# Patient Record
Sex: Female | Born: 2005 | Race: White | Hispanic: No | Marital: Single | Smoking: Never smoker
Health system: Southern US, Community
[De-identification: ages and names within clinical notes are randomized; demographics above are authoritative.]

## PROBLEM LIST (undated history)

## (undated) DIAGNOSIS — T7840XA Allergy, unspecified, initial encounter: Secondary | ICD-10-CM

## (undated) DIAGNOSIS — F419 Anxiety disorder, unspecified: Secondary | ICD-10-CM

## (undated) DIAGNOSIS — G43909 Migraine, unspecified, not intractable, without status migrainosus: Secondary | ICD-10-CM

## (undated) HISTORY — DX: Allergy, unspecified, initial encounter: T78.40XA

---
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
## 2023-01-19 ENCOUNTER — Ambulatory Visit (INDEPENDENT_AMBULATORY_CARE_PROVIDER_SITE_OTHER): Payer: BC Managed Care – PPO

## 2023-01-19 ENCOUNTER — Encounter: Payer: Self-pay | Admitting: Family Medicine

## 2023-01-19 ENCOUNTER — Other Ambulatory Visit: Payer: Self-pay | Admitting: Family Medicine

## 2023-01-19 ENCOUNTER — Ambulatory Visit (INDEPENDENT_AMBULATORY_CARE_PROVIDER_SITE_OTHER): Payer: BC Managed Care – PPO | Admitting: Family Medicine

## 2023-01-19 VITALS — BP 116/77 | HR 85 | Temp 98.7°F | Ht 66.0 in | Wt 316.0 lb

## 2023-01-19 DIAGNOSIS — M79671 Pain in right foot: Secondary | ICD-10-CM | POA: Diagnosis not present

## 2023-01-19 DIAGNOSIS — M25572 Pain in left ankle and joints of left foot: Secondary | ICD-10-CM | POA: Diagnosis not present

## 2023-01-19 DIAGNOSIS — Z6841 Body Mass Index (BMI) 40.0 and over, adult: Secondary | ICD-10-CM

## 2023-01-19 DIAGNOSIS — M79672 Pain in left foot: Secondary | ICD-10-CM

## 2023-01-19 MED ORDER — NAPROXEN 500 MG PO TABS: 500.0000 mg | ORAL_TABLET | Freq: Two times a day (BID) | ORAL | 0 refills | Status: AC

## 2023-01-19 NOTE — Progress Notes (Addendum)
Acute Office Visit  Subjective:     Patient ID: Dana Lopez, female    DOB: [birth year], 17 y.o.   MRN: [id number]  Chief Complaint  Patient presents with   left ankle pain    Flares up and goes away   HPI Patient is in today for foot pain  Has hx of plantar fascitis  States that her feet or her ankles hurt. That they alternate. Contributes it to her weight. Is constantly on her feet for her jobs. Hurts most when she comes home from work. Previously prescribed stretches. Completes them once every other day. Had a referral placed for podiatry but did not go due to mother being admitted to hospital. Denies numbness, tingling, and falls, trauma. States that the pain is bilateral. Wears brooks. States that her brooks are 40 months old. 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   ROS As per HPI    Objective:    BP 116/77   Pulse 85   Temp 98.7 F (37.1 C)   Ht 5' 6"$  (1.676 m)   Wt (!) 316 lb (143.3 kg)   LMP 01/13/2023 (Exact Date)   SpO2 95%   BMI 51.00 kg/m    Physical Exam Constitutional:      General: She is not in acute distress.    Appearance: Normal appearance. She is not ill-appearing, toxic-appearing or diaphoretic.  Cardiovascular:     Rate and Rhythm: Normal rate.     Pulses: Normal pulses.     Heart sounds: Normal heart sounds. No murmur heard.    No gallop.  Pulmonary:     Effort: Pulmonary effort is normal. No respiratory distress.     Breath sounds: Normal breath sounds. No stridor. No wheezing, rhonchi or rales.  Musculoskeletal:     Right ankle: No swelling, deformity, ecchymosis or lacerations. No tenderness. Normal range of motion.     Left ankle: No swelling, deformity, ecchymosis or lacerations. Tenderness present over the proximal fibula. Normal range of motion.     Right foot: Normal range of motion and normal capillary refill. No swelling, deformity, bunion, Charcot foot, foot drop, prominent metatarsal heads,  laceration, tenderness, bony tenderness or crepitus. Normal pulse.     Left foot: Normal range of motion and normal capillary refill. No swelling, deformity, bunion, Charcot foot, foot drop, prominent metatarsal heads, laceration, tenderness, bony tenderness or crepitus. Normal pulse.  Skin:    General: Skin is warm.     Capillary Refill: Capillary refill takes less than 2 seconds.  Neurological:     General: No focal deficit present.     Mental Status: She is alert and oriented to person, place, and time. Mental status is at baseline.     Motor: No weakness.  Psychiatric:        Mood and Affect: Mood normal.        Behavior: Behavior normal.        Thought Content: Thought content normal.        Judgment: Judgment normal.         Assessment & Plan:  1. Left foot pain Previously seen for foot pain and diagnosed with plantar fascitis. Completed conservative treatment. Failed conservative management. Will complete imaging today to rule out stress fracture. Will communicate results to patient once available. Previous imaging from 07/30/21 of lumbar spine with low concern of S1 radiculopathy. Due to allergy of cortisone limited in injection therapy for plantar fascitis. Bilateral  braces provided to patient today in clinic. Medication as below for pain control. Referrals placed to assist with management given failed conservative management.  - Ambulatory referral to Podiatry - Ambulatory referral to Physical Therapy - naproxen (NAPROSYN) 500 MG tablet; Take 1 tablet (500 mg total) by mouth 2 (two) times daily with a meal for 15 days.  Dispense: 30 tablet; Refill: 0  2. BMI 50.0-59.9, adult ([place]) Pt aware that weight reduction would assist with symptom management. States that she is trying to lose weight and is struggling to manage. Pt requested assistance. Referral placed as below.  - Amb ref to Medical Nutrition Therapy-MNT  The above assessment and management plan was discussed with the  patient. The patient verbalized understanding of and has agreed to the management plan using shared-decision making. Patient is aware to call the clinic if they develop any new symptoms or if symptoms fail to improve or worsen. Patient is aware when to return to the clinic for a follow-up visit. Patient educated on when it is appropriate to go to the emergency department.   Donzetta Kohut, DNP-FNP [facility] [address] [place], [place] [address] [phone]

## 2023-01-27 ENCOUNTER — Encounter: Payer: Self-pay | Admitting: Podiatry

## 2023-01-27 ENCOUNTER — Ambulatory Visit (INDEPENDENT_AMBULATORY_CARE_PROVIDER_SITE_OTHER): Payer: BC Managed Care – PPO | Admitting: Podiatry

## 2023-01-27 DIAGNOSIS — Q666 Other congenital valgus deformities of feet: Secondary | ICD-10-CM

## 2023-01-27 DIAGNOSIS — R52 Pain, unspecified: Secondary | ICD-10-CM | POA: Diagnosis not present

## 2023-01-27 NOTE — Progress Notes (Signed)
  Subjective:  Patient ID: Dana Lopez, female    DOB: [birth year],  MRN: [id number]  Chief Complaint  Patient presents with   Foot Pain    Left foot pain  Pt stated that she has xrays in the system    17 y.o. female presents with the above complaint.  Patient presents with left foot pain generally in the arch and heel.  Patient states this came out of nowhere has progressive gotten worse.  She states she had x-rays done in the system.She has not seen a neurologist prior to seeing me.  She denies any other acute complaints.  She has not immobilized the foot and ankle she denies any injury.   Review of Systems: Negative except as noted in the HPI. Denies N/V/F/Ch.  Past Medical History:  Diagnosis Date   Allergy    Migraine     Current Outpatient Medications:    loratadine (CLARITIN) 10 MG tablet, , Disp: , Rfl:    meloxicam (MOBIC) 7.5 MG tablet, Take 1 tablet (7.5 mg total) by mouth daily as needed for pain., Disp: 30 tablet, Rfl: 1  Social History   Tobacco Use  Smoking Status Never  Smokeless Tobacco Never    Allergies  Allergen Reactions   Cortisone     Decreased HR, increased HA (shot only) Prednisone pills ok- per pt and mom    Adhesive [Tape] Rash   Objective:  There were no vitals filed for this visit. There is no height or weight on file to calculate BMI. Constitutional Well developed. Well nourished.  Vascular Dorsalis pedis pulses palpable bilaterally. Posterior tibial pulses palpable bilaterally. Capillary refill normal to all digits.  No cyanosis or clubbing noted. Pedal hair growth normal.  Neurologic Normal speech. Oriented to person, place, and time. Epicritic sensation to light touch grossly present bilaterally.  Dermatologic Nails well groomed and normal in appearance. No open wounds. No skin lesions.  Orthopedic: Pain on palpation to the left arch and heel.  Generalized pain noted across the plantar fascia.  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 Generalized pain   2. Pes planovalgus    Plan:  Patient was evaluated and treated and all questions answered.  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  Right now she has a lot of generalized pain to the foot and ankle.  Could likely be due to overuse injury.  I discussed this with the patient and she states understanding. -Cam boot was dispensed  Pes planovalgus -I explained to patient the etiology of pes planovalgus and relationship with Planter fasciitis/arch pain and various treatment options were discussed.  Given patient foot structure in the setting of Planter fasciitis/arch pain I believe patient will benefit from custom-made orthotics to help control the hindfoot motion support the arch of the foot and take the stress away from plantar fascial.  Patient agrees with the plan like to proceed with orthotics -Patient was casted for orthotics

## 2023-02-18 ENCOUNTER — Ambulatory Visit (INDEPENDENT_AMBULATORY_CARE_PROVIDER_SITE_OTHER): Payer: BC Managed Care – PPO | Admitting: Podiatry

## 2023-02-18 DIAGNOSIS — Q666 Other congenital valgus deformities of feet: Secondary | ICD-10-CM | POA: Diagnosis not present

## 2023-02-18 DIAGNOSIS — R52 Pain, unspecified: Secondary | ICD-10-CM | POA: Diagnosis not present

## 2023-02-18 NOTE — Progress Notes (Signed)
  Subjective:  Patient ID: Dana Lopez, female    DOB: 03-02-2006,  MRN: [id number]  Chief Complaint  Patient presents with   Foot Pain    Pt stated that there has been some improvement     17 y.o. female presents with the above complaint.  Patient presents for follow-up of generalized pain to the left foot.  She states she is doing a lot better cam boot immobilization helped.  She still has some achiness but has improved considerably.   Review of Systems: Negative except as noted in the HPI. Denies N/V/F/Ch.  Past Medical History:  Diagnosis Date   Allergy    Migraine     Current Outpatient Medications:    loratadine (CLARITIN) 10 MG tablet, , Disp: , Rfl:    meloxicam (MOBIC) 7.5 MG tablet, Take 1 tablet (7.5 mg total) by mouth daily as needed for pain., Disp: 30 tablet, Rfl: 1  Social History   Tobacco Use  Smoking Status Never  Smokeless Tobacco Never    Allergies  Allergen Reactions   Cortisone     Decreased HR, increased HA (shot only) Prednisone pills ok- per pt and mom    Adhesive [Tape] Rash   Objective:  There were no vitals filed for this visit. There is no height or weight on file to calculate BMI. Constitutional Well developed. Well nourished.  Vascular Dorsalis pedis pulses palpable bilaterally. Posterior tibial pulses palpable bilaterally. Capillary refill normal to all digits.  No cyanosis or clubbing noted. Pedal hair growth normal.  Neurologic Normal speech. Oriented to person, place, and time. Epicritic sensation to light touch grossly present bilaterally.  Dermatologic Nails well groomed and normal in appearance. No open wounds. No skin lesions.  Orthopedic: No further pain on palpation to the left arch and heel.  No further generalized pain noted across the plantar fascia.  She has severe pes planovalgus deformity noted with calcaneovalgus to many toe signs partially able to recruit the arch with dorsiflexion of the hallux able to perform  single and double heel raise with return of calcaneus to neutral position   Radiographs: None Assessment:   No diagnosis found.  Plan:  Patient was evaluated and treated and all questions answered.  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  She will transition out of cam boot into regular shoes with a Tri-Lock ankle brace Tri-Lock ankle brace was dispensed.  If it continues to bother her she will come back and see me. -Also discussed shoe gear modification  Pes planovalgus -I explained to patient the etiology of pes planovalgus and relationship with Planter fasciitis/arch pain and various treatment options were discussed.  Given patient foot structure in the setting of Planter fasciitis/arch pain I believe patient will benefit from custom-made orthotics to help control the hindfoot motion support the arch of the foot and take the stress away from plantar fascial.  Patient agrees with the plan like to proceed with orthotics -Patient is awaiting orthotics

## 2023-03-01 ENCOUNTER — Telehealth: Payer: Self-pay | Admitting: Podiatry

## 2023-03-01 NOTE — Telephone Encounter (Signed)
Lmom for patient to call back to set up appt to pick up orthotics   No balance

## 2023-03-03 ENCOUNTER — Ambulatory Visit: Payer: BC Managed Care – PPO | Admitting: Nutrition

## 2023-03-10 ENCOUNTER — Telehealth (INDEPENDENT_AMBULATORY_CARE_PROVIDER_SITE_OTHER): Payer: BC Managed Care – PPO | Admitting: Family Medicine

## 2023-03-10 ENCOUNTER — Encounter: Payer: Self-pay | Admitting: Family Medicine

## 2023-03-10 DIAGNOSIS — J069 Acute upper respiratory infection, unspecified: Secondary | ICD-10-CM

## 2023-03-10 MED ORDER — CHLORPHEN-PE-ACETAMINOPHEN 4-10-325 MG PO TABS: 1.0000 | ORAL_TABLET | Freq: Four times a day (QID) | ORAL | 0 refills | Status: DC | PRN

## 2023-03-10 NOTE — Progress Notes (Signed)
Virtual Visit via Video   I connected with patient on 03/10/23 at 1245 by a video enabled telemedicine application and verified that I am speaking with the correct person using two identifiers.  Location patient: Home Location provider: [facility] Office Persons participating in the virtual visit: Patient and Provider  I discussed the limitations of evaluation and management by telemedicine and the availability of in person appointments. The patient expressed understanding and agreed to proceed.  Subjective:   HPI:  Pt presents today for  Chief Complaint  Patient presents with   URI   URI  This is a new problem. Episode onset: 3 days ago. The problem has been gradually improving. The maximum temperature recorded prior to her arrival was 101 - 101.9 F. The fever has been present for Less than 1 day. Associated symptoms include congestion, coughing, headaches, rhinorrhea, a sore throat and swollen glands. Pertinent negatives include no abdominal pain, chest pain, diarrhea, dysuria, ear pain, joint pain, joint swelling, nausea, neck pain, plugged ear sensation, rash, sinus pain, sneezing, vomiting or wheezing. She has tried acetaminophen and decongestant for the symptoms. The treatment provided moderate relief.     Review of Systems  Constitutional:  Positive for chills, fever and malaise/fatigue. Negative for diaphoresis and weight loss.  HENT:  Positive for congestion, rhinorrhea and sore throat. Negative for ear discharge, ear pain, hearing loss, nosebleeds, sinus pain, sneezing and tinnitus.   Respiratory:  Positive for cough. Negative for sputum production, shortness of breath, wheezing and stridor.   Cardiovascular:  Negative for chest pain and palpitations.  Gastrointestinal:  Negative for abdominal pain, blood in stool, constipation, diarrhea, heartburn, melena, nausea and vomiting.  Genitourinary:  Negative for dysuria, flank pain, hematuria and  urgency.  Musculoskeletal:  Negative for back pain, falls, joint pain, myalgias and neck pain.  Skin:  Negative for rash.  Neurological:  Positive for headaches. Negative for dizziness, tingling, tremors, sensory change, speech change, focal weakness, seizures, loss of consciousness and weakness.  All other systems reviewed and are negative.    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   Menorrhagia with irregular cycle 07/29/2016   Essential hypertension 12/31/2015   Snoring 09/30/2015   Obesity peds (BMI >=95 percentile) 03/05/2014    Social History   Tobacco Use   Smoking status: Never   Smokeless tobacco: Never  Substance Use Topics   Alcohol use: No    Current Outpatient Medications:    Chlorphen-PE-Acetaminophen 4-10-325 MG TABS, Take 1 tablet by mouth every 6 (six) hours as needed., Disp: 20 tablet, Rfl: 0   loratadine (CLARITIN) 10 MG tablet, , Disp: , Rfl:    meloxicam (MOBIC) 7.5 MG tablet, Take 1 tablet (7.5 mg total) by mouth daily as needed for pain., Disp: 30 tablet, Rfl: 1  Allergies  Allergen Reactions   Cortisone     Decreased HR, increased HA (shot only) Prednisone pills ok- per pt and mom    Adhesive [Tape] Rash    Objective:   There were no vitals taken for this visit.  Patient is well-developed, well-nourished in no  acute distress.  Resting comfortably at home.  Head is normocephalic, atraumatic.  No labored breathing.  Speech is clear and coherent with logical content.  Patient is alert and  oriented at baseline.  No distress noted  Assessment and Plan:   Dana Lopez was seen today for uri.  Diagnoses and all orders for this visit:  URI with cough and congestion Symptoms are gradually improving. No indications of acute bacterial infections. Will treat symptoms with below. Aware to report new, worsening, or return of symptoms. Symptomatic care discussed in detail.  -     Chlorphen-PE-Acetaminophen 4-10-325 MG TABS; Take 1 tablet by mouth every 6 (six) hours as needed.      Return if symptoms worsen or fail to improve.  Kari Baars, FNP-C [facility] [address] [place], [place] [address] [phone]  03/10/2023  Time spent with the patient: 13 minutes, of which >50% was spent in obtaining information about symptoms, reviewing previous labs, evaluations, and treatments, counseling about condition (please see the discussed topics above), and developing a plan to further investigate it; had a number of questions which I addressed.

## 2023-04-01 ENCOUNTER — Ambulatory Visit: Payer: BC Managed Care – PPO | Admitting: Nutrition

## 2023-05-03 ENCOUNTER — Ambulatory Visit: Payer: BC Managed Care – PPO | Admitting: Nutrition

## 2023-06-08 ENCOUNTER — Ambulatory Visit: Payer: BC Managed Care – PPO | Admitting: Physical Therapy

## 2023-06-09 ENCOUNTER — Other Ambulatory Visit: Payer: BC Managed Care – PPO

## 2023-07-05 ENCOUNTER — Telehealth (INDEPENDENT_AMBULATORY_CARE_PROVIDER_SITE_OTHER): Payer: BC Managed Care – PPO | Admitting: Family Medicine

## 2023-07-05 DIAGNOSIS — H60332 Swimmer's ear, left ear: Secondary | ICD-10-CM | POA: Diagnosis not present

## 2023-07-05 MED ORDER — CIPROFLOXACIN-DEXAMETHASONE 0.3-0.1 % OT SUSP: 4.0000 [drp] | Freq: Two times a day (BID) | OTIC | 0 refills | Status: AC

## 2023-07-05 NOTE — Progress Notes (Signed)
MyChart Video visit  Subjective: IO:[id number] PCP: Raliegh Ip, DO [id number] Dana Lopez is a 17 y.o. female. Patient provides verbal consent for consult held via video.  Due to COVID-19 pandemic this visit was conducted virtually. This visit type was conducted due to national recommendations for restrictions regarding the COVID-19 Pandemic (e.g. social distancing, sheltering in place) in an effort to limit this patient's exposure and mitigate transmission in our community. All issues noted in this document were discussed and addressed.  A physical exam was not performed with this format.   Location of patient: home Location of provider: [facility] Others present for call: none  1. Earache Patient reports that she was at the beach last [place].  She reports she started having left ear pain.  She reports mild drainage out of ear.  Reports subjective fever at home.  No myalgia, shortness of breath, cough.  Pain radiating to jaw. Took tylenol.     ROS: Per HPI  Allergies  Allergen Reactions   Cortisone     Decreased HR, increased HA (shot only) Prednisone pills ok- per pt and mom    Adhesive [Tape] Rash   Past Medical History:  Diagnosis Date   Allergy    Migraine     Current Outpatient Medications:    Chlorphen-PE-Acetaminophen 4-10-325 MG TABS, Take 1 tablet by mouth every 6 (six) hours as needed., Disp: 20 tablet, Rfl: 0   loratadine (CLARITIN) 10 MG tablet, , Disp: , Rfl:    meloxicam (MOBIC) 7.5 MG tablet, Take 1 tablet (7.5 mg total) by mouth daily as needed for pain., Disp: 30 tablet, Rfl: 1 Gen: Well-appearing female no acute distress. HEENT: No facial swelling or redness appreciated  Assessment/ Plan: 17 y.o. female   Acute swimmer's ear of left side - Plan: ciprofloxacin-dexamethasone (CIPRODEX) OTIC suspension  Suspect acute otitis externa.  Ciprodex sent.  Home care instructions reviewed and reasons for reevaluation discussed.  She will follow-up as needed  Start  time: 1:30pm End time: 1:34pm  Total time spent on patient care (including video visit/ documentation): 5 minutes  Osha Rane Hulen Skains, DO [facility] [phone]

## 2023-07-06 DIAGNOSIS — H6502 Acute serous otitis media, left ear: Secondary | ICD-10-CM | POA: Diagnosis not present

## 2023-07-06 DIAGNOSIS — H60502 Unspecified acute noninfective otitis externa, left ear: Secondary | ICD-10-CM | POA: Diagnosis not present

## 2023-07-10 ENCOUNTER — Other Ambulatory Visit: Payer: Self-pay

## 2023-07-10 ENCOUNTER — Emergency Department (HOSPITAL_COMMUNITY)
Admission: EM | Admit: 2023-07-10 | Discharge: 2023-07-10 | Disposition: A | Discharge status: Home / Self Care | Payer: BC Managed Care – PPO | Source: Home / Self Care | Attending: Emergency Medicine | Admitting: Emergency Medicine

## 2023-07-10 DIAGNOSIS — H60502 Unspecified acute noninfective otitis externa, left ear: Secondary | ICD-10-CM

## 2023-07-10 DIAGNOSIS — H6092 Unspecified otitis externa, left ear: Secondary | ICD-10-CM | POA: Diagnosis not present

## 2023-07-10 DIAGNOSIS — H9202 Otalgia, left ear: Secondary | ICD-10-CM | POA: Diagnosis not present

## 2023-07-10 MED ORDER — OXYCODONE-ACETAMINOPHEN 5-325 MG PO TABS
2.0000 | ORAL_TABLET | Freq: Once | ORAL | Status: AC
  Administered 2023-07-10: 2 via ORAL
  Filled 2023-07-10: qty 2

## 2023-07-10 NOTE — Discharge Instructions (Addendum)
We have given you a dose of pain medication and have also placed a wick in your ear.  Please make sure that you are putting the drops in your ear, 4 or 5 drops at a time, unfortunately this can take several days to start to feel like it is getting better so in the meantime please make sure you are using the medications exactly as prescribed, taking the oral antibiotics exactly as prescribed and following up with an ear nose and throat specialist as I have outlined above.  Return to the ER for severe pain or fevers or worsening symptoms

## 2023-07-10 NOTE — ED Triage Notes (Signed)
Pt dx with swimmers ear on 5 days ago however the ear drops prescribed were no available at the pharmacy. The next day went to urgent care and given antibiotic of amoxicillin. Pt states that ear has been getting worse and left side of face is swelling. And today ear start draining.

## 2023-07-10 NOTE — ED Provider Notes (Signed)
[place] EMERGENCY DEPARTMENT AT [facility] Provider Note   CSN: [id number] Arrival date & time: 07/10/23  2047     History  Chief Complaint  Patient presents with   Otalgia    Dana Lopez is a 17 y.o. female.   Otalgia  17 year old female presenting with a complaint of left-sided ear pain.  She has been to the doctor several times over this week and has received 2 different prescriptions for antibiotics both drops for otitis externa as well as for an oral antibiotic secondary to some welling around the ear.  Today she had some drainage from the ear, she has felt some increasing pain, they have not started the topical drops because they were not able to pick it up until tonight.  She states that there has been no fevers but she does have a headache and some pain around that ear.    Home Medications Prior to Admission medications   Medication Sig Start Date End Date Taking? Authorizing Provider  Chlorphen-PE-Acetaminophen 4-10-325 MG TABS Take 1 tablet by mouth every 6 (six) hours as needed. 03/10/23   Sonny Masters, FNP  ciprofloxacin-dexamethasone (CIPRODEX) OTIC suspension Place 4 drops into the left ear 2 (two) times daily for 7 days. 07/05/23 07/12/23  Raliegh Ip, DO  loratadine (CLARITIN) 10 MG tablet  03/03/18   [provider]  meloxicam (MOBIC) 7.5 MG tablet Take 1 tablet (7.5 mg total) by mouth daily as needed for pain. 10/30/22   Raliegh Ip, DO      Allergies    Cortisone and Adhesive [tape]    Review of Systems   Review of Systems  HENT:  Positive for ear pain.     Physical Exam Updated Vital Signs BP (!) 141/91 (BP Location: Right Arm)   Pulse 92   Temp 99 F (37.2 C) (Oral)   Ht 1.676 m (5\' 6" )   Wt (!) 145.2 kg   LMP 06/23/2023 (Exact Date)   SpO2 100%   BMI 51.65 kg/m  Physical Exam Vitals and nursing note reviewed.  Constitutional:      Appearance: She is well-developed. She is not diaphoretic.  HENT:      Head: Normocephalic and atraumatic.     Ears:     Comments: Right tympanic membrane visualized and normal, no pain on the right external auditory canal or manipulation of the auricle or tragus.  The left tympanic membrane is not visualized, the external auditory canal is edematous and tender, there is pain with manipulation of the auricle and the tragus, there is no tenderness over the mastoid.  There is some purulent drainage coming from the inside of the ear Eyes:     General:        Right eye: No discharge.        Left eye: No discharge.     Conjunctiva/sclera: Conjunctivae normal.  Pulmonary:     Effort: Pulmonary effort is normal. No respiratory distress.  Skin:    General: Skin is warm and dry.     Findings: No erythema or rash.  Neurological:     Mental Status: She is alert.     Coordination: Coordination normal.     ED Results / Procedures / Treatments   Labs (all labs ordered are listed, but only abnormal results are displayed) Labs Reviewed - No data to display  EKG None  Radiology No results found.  Procedures Procedures    Medications Ordered in ED Medications  oxyCODONE-acetaminophen (PERCOCET/ROXICET)  5-325 MG per tablet 2 tablet (has no administration in time range)    ED Course/ Medical Decision Making/ A&P                                 Medical Decision Making  Overall patient is well-appearing, she is not febrile, she is not tachycardic, she has tenderness around the left ear consistent with having otitis externa.  She is up to this point not been adequately treated she has not been using the drops but now has them on her person.  I have recommended that they continue the Augmentin, the drops, I have placed an ear wick, the patient tolerated this well, I have referred her to ENT, the patient is agreeable, the family member here is also willing to help out.  Stable for discharge        Final Clinical Impression(s) / ED Diagnoses Final diagnoses:   Acute otitis externa of left ear, unspecified type    Rx / DC Orders ED Discharge Orders     None         Eber Hong, MD 07/10/23 2250

## 2023-08-31 ENCOUNTER — Encounter: Payer: Self-pay | Admitting: Nurse Practitioner

## 2023-08-31 ENCOUNTER — Ambulatory Visit (INDEPENDENT_AMBULATORY_CARE_PROVIDER_SITE_OTHER): Payer: BC Managed Care – PPO | Admitting: Nurse Practitioner

## 2023-08-31 VITALS — BP 112/74 | HR 82 | Temp 97.2°F | Resp 20 | Ht 66.0 in | Wt 314.4 lb

## 2023-08-31 DIAGNOSIS — F419 Anxiety disorder, unspecified: Secondary | ICD-10-CM | POA: Diagnosis not present

## 2023-08-31 DIAGNOSIS — F411 Generalized anxiety disorder: Secondary | ICD-10-CM | POA: Diagnosis not present

## 2023-08-31 DIAGNOSIS — F331 Major depressive disorder, recurrent, moderate: Secondary | ICD-10-CM | POA: Diagnosis not present

## 2023-08-31 DIAGNOSIS — F32A Depression, unspecified: Secondary | ICD-10-CM | POA: Diagnosis not present

## 2023-08-31 MED ORDER — ESCITALOPRAM OXALATE 10 MG PO TABS: 10.0000 mg | ORAL_TABLET | Freq: Every day | ORAL | 1 refills | Status: DC

## 2023-08-31 NOTE — Progress Notes (Signed)
Acute Office Visit  Subjective:     Patient ID: Dana Lopez, female    DOB: 16-Sep-2006, 17 y.o.   MRN: [id number]  Chief Complaint  Patient presents with   Anxiety   HPI Dana Lopez is 17 yrs old Lesbian female present today with her mother for an acute visit for anxiety. Verbal consent obtained from her to have her mother present in the room for this encounter.  Anxiety: Patient complains of anxiety disorder, sleep disturbance, and "I can fall asleep, but not able to stay asleep 5-6 hrs in a good night and in a not so good night 2-3 hrs" .  She has the following symptoms: difficulty concentrating, dizziness, fatigue, insomnia, racing thoughts, sweating. Onset of symptoms was approximately 1 year ago, fluctuates since that time "at time it is better and other time it is worst. I work 2-jobs and I am having relationship issues. It is always worst around my birthday. I am having hard dealing with getting older and contemplating on going back to school caused me anxiety". She is in a long distance relationship ' we are on a break right and trying to figure out what we have going on, been together for 23yrs and she my best friend. I feel like I am losing my partner ad best friend".Anxiety is the worst "when I am around a lot of people", moving away help improved anxiety  She denies current suicidal and homicidal ideation. Family history significant for anxiety and depression.Possible organic causes contributing are: none. Risk factors: positive family history in  mother and sister(s), negative life event LGBT and having relationship issues, previous episode of depression, and    Previous treatment includes  Has ben in counseling in the past 2 yrs ago for anxiety and depression, and prescribed Visatril, Zoloft, Prozac, Wellbutrin" and individual therapy.  She complains of the following side effects from the treatment:  tiredness, not feeling like herself .  She is receptive to trying Lexapro,  counseling and Genesight test d/t multiple medications failures. Client is requesting to go back her previous counselor  LMP: 9/23/204,      08/31/2023   10:01 AM 01/19/2023   11:12 AM 10/30/2022    1:21 PM  PHQ9 SCORE ONLY  PHQ-9 Total Score 7 8 10        08/31/2023   10:02 AM 01/19/2023   11:13 AM 10/30/2022    1:21 PM 07/02/2022    3:38 PM  GAD 7 : Generalized Anxiety Score  Nervous, Anxious, on Edge 2 2 1 1   Control/stop worrying 2 2 1 1   Worry too much - different things 2 2 1 1   Trouble relaxing 2 2 2 2   Restless 2 2 0 2  Easily annoyed or irritable 2 2 1 2   Afraid - awful might happen 2 0 0 0  Total GAD 7 Score 14 12 6 9   Anxiety Difficulty Somewhat difficult Somewhat difficult Somewhat difficult Not difficult at all     Review of Systems  Constitutional:  Negative for chills and fever.  HENT:  Negative for congestion and sore throat.   Eyes:  Negative for blurred vision and pain.  Respiratory:  Negative for cough and wheezing.   Cardiovascular:  Negative for chest pain and leg swelling.  Gastrointestinal:  Positive for nausea.  Genitourinary:  Negative for frequency and urgency.  Musculoskeletal:  Negative for falls and myalgias.  Skin:  Negative for itching and rash.  Neurological:  Negative for dizziness and headaches.  Endo/Heme/Allergies:  Negative for environmental allergies and polydipsia. Does not bruise/bleed easily.  Psychiatric/Behavioral:  Positive for depression. Negative for hallucinations, substance abuse and suicidal ideas. The patient is nervous/anxious and has insomnia.    Negative unless indicated in HPI    Objective:    BP 112/74   Pulse 82   Temp (!) 97.2 F (36.2 C) (Oral)   Resp 20   Ht 5\' 6"  (1.676 m)   Wt (!) 314 lb 6 oz (142.6 kg)   SpO2 98%   BMI 50.74 kg/m  BP Readings from Last 3 Encounters:  08/31/23 112/74  07/10/23 (!) 140/88  01/19/23 116/77   Wt Readings from Last 3 Encounters:  08/31/23 (!) 314 lb 6 oz (142.6 kg) (>99%,  Z= 2.84)*  07/10/23 (!) 320 lb (145.2 kg) (>99%, Z= 2.85)*  01/19/23 (!) 316 lb (143.3 kg) (>99%, Z= 2.79)*   * Growth percentiles are based on CDC (Girls, 2-20 Years) data.    Physical Exam Vitals and nursing note reviewed.  Constitutional:      Appearance: She is obese.  HENT:     Head: Normocephalic and atraumatic.  Eyes:     General: No scleral icterus.    Extraocular Movements: Extraocular movements intact.     Conjunctiva/sclera: Conjunctivae normal.     Pupils: Pupils are equal, round, and reactive to light.  Neck:     Vascular: No carotid bruit.  Cardiovascular:     Rate and Rhythm: Normal rate and regular rhythm.  Pulmonary:     Effort: Pulmonary effort is normal.     Breath sounds: Normal breath sounds.  Musculoskeletal:        General: Normal range of motion.     Cervical back: Normal range of motion and neck supple. No rigidity or tenderness.     Right lower leg: No edema.     Left lower leg: No edema.  Lymphadenopathy:     Cervical: No cervical adenopathy.  Skin:    General: Skin is warm and dry.     Findings: No rash.  Neurological:     Mental Status: She is alert and oriented to person, place, and time. Mental status is at baseline.  Psychiatric:        Mood and Affect: Mood is anxious. Affect is flat.        Speech: Speech normal.        Behavior: Behavior normal. Behavior is cooperative.        Thought Content: Thought content normal. Thought content does not include homicidal or suicidal ideation. Thought content does not include homicidal or suicidal plan.        Cognition and Memory: Cognition and memory normal.        Judgment: Judgment normal.    No results found for any visits on 08/31/23.     Assessment & Plan:  Anxiety and depression -     Ambulatory referral to Psychiatry -     Escitalopram Oxalate; Take 1 tablet (10 mg total) by mouth daily.  Dispense: 30 tablet; Refill: 1  Dana Lopez is 17 yrs old [place] American female, no acute  distress Anxiety/Depression: start Lexapro 10 mg daily, okay to break to pill in 1/2 for the first 7-days - Refer to therapy " my therapy place" requested by client - GeneSight test ordered  -Encourage sleep hygiene - deep Breathing exercise Encourage healthy lifestyle choices, including diet (rich in fruits, vegetables, and lean proteins, and low in salt and simple carbohydrates) and exercise (  at least 30 minutes of moderate physical activity daily).   The above assessment and management plan was discussed with the patient. The patient verbalized understanding of and has agreed to the management plan. Patient is aware to call the clinic if they develop any new symptoms or if symptoms persist or worsen. Patient is aware when to return to the clinic for a follow-up visit. Patient educated on when it is appropriate to go to the emergency department.  Return in about 4 weeks (around 09/28/2023).  Arrie Aran Santa Lighter, DNP [facility] [address] [place], [place] [address] [phone]

## 2023-09-01 ENCOUNTER — Telehealth: Payer: Self-pay | Admitting: Family Medicine

## 2023-09-01 NOTE — Telephone Encounter (Signed)
Referral Form has been completed and faxed to Office as requested.

## 2023-09-07 ENCOUNTER — Telehealth: Payer: Self-pay | Admitting: Family Medicine

## 2023-09-07 NOTE — Telephone Encounter (Signed)
Patient's mother called in to request information put in daughter's chart be removed.  I told her I would send the amendment request to the patient and she should complete and mail back to me to start the amendment process. Paperwork mailed today.

## 2023-09-28 ENCOUNTER — Ambulatory Visit: Payer: BC Managed Care – PPO | Admitting: Family Medicine

## 2023-09-29 ENCOUNTER — Encounter: Payer: Self-pay | Admitting: Family Medicine

## 2023-09-29 ENCOUNTER — Ambulatory Visit (INDEPENDENT_AMBULATORY_CARE_PROVIDER_SITE_OTHER): Payer: BC Managed Care – PPO | Admitting: Family Medicine

## 2023-09-29 VITALS — BP 138/88 | HR 69 | Temp 98.6°F | Ht 66.0 in | Wt 312.0 lb

## 2023-09-29 DIAGNOSIS — R1013 Epigastric pain: Secondary | ICD-10-CM | POA: Diagnosis not present

## 2023-09-29 DIAGNOSIS — R4589 Other symptoms and signs involving emotional state: Secondary | ICD-10-CM | POA: Diagnosis not present

## 2023-09-29 DIAGNOSIS — L659 Nonscarring hair loss, unspecified: Secondary | ICD-10-CM

## 2023-09-29 DIAGNOSIS — R197 Diarrhea, unspecified: Secondary | ICD-10-CM | POA: Diagnosis not present

## 2023-09-29 MED ORDER — OMEPRAZOLE 20 MG PO CPDR: 20.0000 mg | DELAYED_RELEASE_CAPSULE | Freq: Every morning | ORAL | 0 refills | Status: DC

## 2023-09-29 NOTE — Progress Notes (Signed)
Subjective: CC:GAD/Depression PCP: Raliegh Ip, DO GMW:NUUVOZ Dana Lopez is a 17 y.o. female presenting to clinic today for:  1.  Anxiety about health She does have anxiety about health but she notes that this is because she worries about symptoms that she does not feel like she should have.  She never did start the Lexapro that was prescribed by my partner because she was worried about taking more medicine.  She has been on Wellbutrin, Seroquel, Prozac, Atarax and amitriptyline in the past and all of them made her feel poorly.  She reports that she has been having epigastric pain that sometimes radiates up into the chest.  Sometimes this is relieved by Tums but sometimes it does not.  She does not feel like she is eating anything that would cause her to gain weight or maintain a heavy weight and she stays active working 2 jobs and with her nephew.  However, she has not been able to lose any weight.  She reports that her stools are always runny.  They are dark but she is not seeing overt blood.  Sometimes she throws up with she eats and she knows often certain foods will cause her to feel nauseated.  She takes a hot shower bath and sometimes this alleviates some of the crampy/sore feeling she has in her belly.  She eats yogurt.  Does not take any probiotics or any PPIs.  Her mother's medical history significant for Crohn's disease.  She has never seen a gastroenterologist and is a little reluctant to because she feels like when she sees other doctors a lot of focus is placed on her weight as the etiology of all her symptoms. Patient's last menstrual period was 09/20/2023.    ROS: Per HPI  Allergies  Allergen Reactions   Cortisone     Decreased HR, increased HA (shot only) Prednisone pills ok- per pt and mom    Adhesive [Tape] Rash   Past Medical History:  Diagnosis Date   Allergy    Migraine     Current Outpatient Medications:    escitalopram (LEXAPRO) 10 MG tablet, Take 1 tablet  (10 mg total) by mouth daily., Disp: 30 tablet, Rfl: 1   loratadine (CLARITIN) 10 MG tablet, , Disp: , Rfl:  Social History   Socioeconomic History   Marital status: Single    Spouse name: Not on file   Number of children: Not on file   Years of education: Not on file   Highest education level: Not on file  Occupational History   Not on file  Tobacco Use   Smoking status: Never   Smokeless tobacco: Never  Vaping Use   Vaping status: Never Used  Substance and Sexual Activity   Alcohol use: No   Drug use: No   Sexual activity: Never  Other Topics Concern   Not on file  Social History Narrative   Bailynn is an 10th grade student.   She is at [employer].   She lives with both parents.    She has two older sisters.   Social Determinants of Health   Financial Resource Strain: Not on file  Food Insecurity: Not on file  Transportation Needs: Not on file  Physical Activity: Not on file  Stress: Not on file  Social Connections: Not on file  Intimate Partner Violence: Not on file   Family History  Problem Relation Age of Onset   Crohn's disease Mother    Depression Mother    Asthma Mother  Hypertension Father     Objective: Office vital signs reviewed. BP 138/88   Pulse 69   Temp 98.6 F (37 C)   Ht 5\' 6"  (1.676 m)   Wt (!) 312 lb (141.5 kg)   LMP 09/20/2023   SpO2 99%   BMI 50.36 kg/m   Physical Examination:  General: Awake, alert, obese and well appearing female, No acute distress HEENT: sclera white, MMM GI: obese, nondistended Psych: mood stable     09/29/2023   10:00 AM 09/29/2023    9:56 AM 08/31/2023   10:01 AM  Depression screen PHQ 2/9  Decreased Interest  1 1  Down, Depressed, Hopeless 2 1 1   PHQ - 2 Score 2 2 2   Altered sleeping 2 1 1   Tired, decreased energy 2 1 1   Change in appetite  1 1  Feeling bad or failure about yourself   0 1  Trouble concentrating 2 0 1  Moving slowly or fidgety/restless  0 0  Suicidal thoughts  0 0  PHQ-9  Score 8 5 7   Difficult doing work/chores  Somewhat difficult Somewhat difficult      09/29/2023    9:56 AM 08/31/2023   10:02 AM 01/19/2023   11:13 AM 10/30/2022    1:21 PM  GAD 7 : Generalized Anxiety Score  Nervous, Anxious, on Edge 2 2 2 1   Control/stop worrying 2 2 2 1   Worry too much - different things 2 2 2 1   Trouble relaxing 1 2 2 2   Restless 1 2 2  0  Easily annoyed or irritable 2 2 2 1   Afraid - awful might happen 2 2 0 0  Total GAD 7 Score 12 14 12 6   Anxiety Difficulty Somewhat difficult Somewhat difficult Somewhat difficult Somewhat difficult    Assessment/ Plan: 17 y.o. female   Anxiety about health  Diarrhea, unspecified type - Plan: CMP14+EGFR, TSH, T4, free, CBC with Differential/Platelet, H. pylori antigen, stool, Fecal occult blood, imunochemical([facility])  Epigastric pain - Plan: CMP14+EGFR, CBC with Differential/Platelet, H. pylori antigen, stool, omeprazole (PRILOSEC) 20 MG capsule  Morbid obesity (HCC) - Plan: Bayer DCA Hb A1c Waived, CMP14+EGFR, Lipid panel, TSH, T4, free, VITAMIN D 25 Hydroxy (Vit-D Deficiency, Fractures), Cortisol-am, blood  Hair thinning - Plan: TSH, T4, free  Understandably has some anxiety surrounding her health.  I will be glad to evaluate for any metabolic causes of her symptoms.  I have placed future orders for labs.  She requested cortisol level and I am glad to order that but we discussed much of her physique does not match a true cushingoid body.  I think collecting this lab will alleviate some anxiety however.  Will perform stool studies to evaluate for H. pylori infection as well as FOBT given reports of dark stool.  I think it may be reasonable for her to see a gastroenterologist at some point given family history of Crohn's disease.  I am going to trial her on a PPI for the next month and see her back closely and we can reevaluate symptoms and plan next steps.  We did briefly discuss GLP since she is concerned about her  weight.  I think this might help with some of her lower GI symptoms as well as I have seen that improve bowel movements in many patients.  Total time spent with patient 26 mins.  Greater than 50% of encounter spent in coordination of care/counseling.    Raliegh Ip, DO [facility] [phone]

## 2023-09-29 NOTE — Patient Instructions (Signed)
Get a probiotic for diarrheal symptoms (align, culturelle, etc).  I've included a diet to help with irritable bowel syndrome as well.

## 2023-10-01 ENCOUNTER — Other Ambulatory Visit: Payer: BC Managed Care – PPO

## 2023-10-06 ENCOUNTER — Other Ambulatory Visit: Payer: BC Managed Care – PPO

## 2023-10-08 ENCOUNTER — Other Ambulatory Visit: Payer: BC Managed Care – PPO

## 2023-10-08 ENCOUNTER — Telehealth: Payer: Self-pay | Admitting: Family Medicine

## 2023-10-08 DIAGNOSIS — L659 Nonscarring hair loss, unspecified: Secondary | ICD-10-CM

## 2023-10-08 DIAGNOSIS — R1013 Epigastric pain: Secondary | ICD-10-CM

## 2023-10-08 DIAGNOSIS — R197 Diarrhea, unspecified: Secondary | ICD-10-CM | POA: Diagnosis not present

## 2023-10-08 LAB — BAYER DCA HB A1C WAIVED: HB A1C (BAYER DCA - WAIVED): 5.5 % (ref 4.8–5.6)

## 2023-10-08 NOTE — Telephone Encounter (Signed)
Please see if results are in [facility]

## 2023-10-10 ENCOUNTER — Encounter (HOSPITAL_COMMUNITY): Payer: Self-pay

## 2023-10-10 ENCOUNTER — Telehealth: Payer: BC Managed Care – PPO | Admitting: Family Medicine

## 2023-10-10 ENCOUNTER — Emergency Department (HOSPITAL_COMMUNITY)
Admission: EM | Admit: 2023-10-10 | Discharge: 2023-10-10 | Disposition: A | Discharge status: Home / Self Care | Payer: BC Managed Care – PPO | Attending: Emergency Medicine | Admitting: Emergency Medicine

## 2023-10-10 ENCOUNTER — Other Ambulatory Visit: Payer: Self-pay

## 2023-10-10 DIAGNOSIS — F32A Depression, unspecified: Secondary | ICD-10-CM

## 2023-10-10 DIAGNOSIS — D72829 Elevated white blood cell count, unspecified: Secondary | ICD-10-CM | POA: Diagnosis not present

## 2023-10-10 DIAGNOSIS — R400 Somnolence: Secondary | ICD-10-CM | POA: Diagnosis not present

## 2023-10-10 DIAGNOSIS — R197 Diarrhea, unspecified: Secondary | ICD-10-CM | POA: Diagnosis not present

## 2023-10-10 DIAGNOSIS — R112 Nausea with vomiting, unspecified: Secondary | ICD-10-CM

## 2023-10-10 DIAGNOSIS — F419 Anxiety disorder, unspecified: Secondary | ICD-10-CM | POA: Diagnosis not present

## 2023-10-10 DIAGNOSIS — R5383 Other fatigue: Secondary | ICD-10-CM | POA: Insufficient documentation

## 2023-10-10 DIAGNOSIS — F410 Panic disorder [episodic paroxysmal anxiety] without agoraphobia: Secondary | ICD-10-CM | POA: Diagnosis not present

## 2023-10-10 HISTORY — DX: Anxiety disorder, unspecified: F41.9

## 2023-10-10 LAB — CBC
HCT: 41.3 % (ref 36.0–46.0)
Hemoglobin: 13.4 g/dL (ref 12.0–15.0)
MCH: 27.5 pg (ref 26.0–34.0)
MCHC: 32.4 g/dL (ref 30.0–36.0)
MCV: 84.6 fL (ref 80.0–100.0)
Platelets: 398 10*3/uL (ref 150–400)
RBC: 4.88 MIL/uL (ref 3.87–5.11)
RDW: 13.5 % (ref 11.5–15.5)
WBC: 12.1 10*3/uL — ABNORMAL HIGH (ref 4.0–10.5)
nRBC: 0 % (ref 0.0–0.2)

## 2023-10-10 LAB — HEPATIC FUNCTION PANEL
ALT: 25 U/L (ref 0–44)
AST: 26 U/L (ref 15–41)
Albumin: 3.4 g/dL — ABNORMAL LOW (ref 3.5–5.0)
Alkaline Phosphatase: 89 U/L (ref 38–126)
Bilirubin, Direct: <0.1 mg/dL (ref 0.0–0.2)
Total Bilirubin: 0.5 mg/dL (ref <1.2)
Total Protein: 7.4 g/dL (ref 6.5–8.1)

## 2023-10-10 LAB — BASIC METABOLIC PANEL
Anion gap: 7 (ref 5–15)
BUN: 6 mg/dL (ref 6–20)
CO2: 26 mmol/L (ref 22–32)
Calcium: 9.3 mg/dL (ref 8.9–10.3)
Chloride: 104 mmol/L (ref 98–111)
Creatinine, Ser: 1.08 mg/dL — ABNORMAL HIGH (ref 0.44–1.00)
GFR, Estimated: >60 mL/min (ref >60)
Glucose, Bld: 102 mg/dL — ABNORMAL HIGH (ref 70–99)
Potassium: 3.6 mmol/L (ref 3.5–5.1)
Sodium: 137 mmol/L (ref 135–145)

## 2023-10-10 LAB — HCG, SERUM, QUALITATIVE: Preg, Serum: NEGATIVE

## 2023-10-10 LAB — CBG MONITORING, ED: Glucose-Capillary: 95 mg/dL (ref 70–99)

## 2023-10-10 MED ORDER — LIDOCAINE-EPINEPHRINE-TETRACAINE (LET) TOPICAL GEL
3.0000 mL | Freq: Once | TOPICAL | Status: DC
  Filled 2023-10-10: qty 3

## 2023-10-10 MED ORDER — ONDANSETRON HCL 4 MG/2ML IJ SOLN
4.0000 mg | Freq: Once | INTRAMUSCULAR | Status: AC
  Administered 2023-10-10: 4 mg via INTRAVENOUS
  Filled 2023-10-10: qty 2

## 2023-10-10 MED ORDER — ONDANSETRON HCL 4 MG PO TABS: 4.0000 mg | ORAL_TABLET | Freq: Four times a day (QID) | ORAL | 0 refills | Status: DC

## 2023-10-10 MED ORDER — KETOROLAC TROMETHAMINE 15 MG/ML IJ SOLN
15.0000 mg | Freq: Once | INTRAMUSCULAR | Status: AC
  Administered 2023-10-10: 15 mg via INTRAVENOUS
  Filled 2023-10-10: qty 1

## 2023-10-10 MED ORDER — DROPERIDOL 2.5 MG/ML IJ SOLN
1.2500 mg | Freq: Once | INTRAMUSCULAR | Status: AC
  Administered 2023-10-10: 1.25 mg via INTRAVENOUS
  Filled 2023-10-10: qty 2

## 2023-10-10 MED ORDER — LACTATED RINGERS IV BOLUS
1000.0000 mL | Freq: Once | INTRAVENOUS | Status: AC
  Administered 2023-10-10: 1000 mL via INTRAVENOUS

## 2023-10-10 MED ORDER — DIPHENHYDRAMINE HCL 50 MG/ML IJ SOLN
25.0000 mg | Freq: Once | INTRAMUSCULAR | Status: AC
  Administered 2023-10-10: 25 mg via INTRAVENOUS
  Filled 2023-10-10: qty 1

## 2023-10-10 NOTE — Patient Instructions (Signed)

## 2023-10-10 NOTE — Progress Notes (Signed)
Virtual Visit Consent   Dana Lopez, you are scheduled for a virtual visit with a [facility] provider today. Just as with appointments in the office, your consent must be obtained to participate. Your consent will be active for this visit and any virtual visit you may have with one of our providers in the next 365 days. If you have a MyChart account, a copy of this consent can be sent to you electronically.  As this is a virtual visit, video technology does not allow for your provider to perform a traditional examination. This may limit your provider's ability to fully assess your condition. If your provider identifies any concerns that need to be evaluated in person or the need to arrange testing (such as labs, EKG, etc.), we will make arrangements to do so. Although advances in technology are sophisticated, we cannot ensure that it will always work on either your end or our end. If the connection with a video visit is poor, the visit may have to be switched to a telephone visit. With either a video or telephone visit, we are not always able to ensure that we have a secure connection.  By engaging in this virtual visit, you consent to the provision of healthcare and authorize for your insurance to be billed (if applicable) for the services provided during this visit. Depending on your insurance coverage, you may receive a charge related to this service.  I need to obtain your verbal consent now. Are you willing to proceed with your visit today? Dana Lopez has provided verbal consent on 10/10/2023 for a virtual visit (video or telephone). Dana Curio, FNP  Date: 10/10/2023 2:11 PM  Virtual Visit via Video Note   I, Dana Lopez, connected with  Dana Lopez  ([id number], October 17, 2006) on 10/10/23 at  2:00 PM EST by a video-enabled telemedicine application and verified that I am speaking with the correct person using two identifiers.  Location: Patient: Virtual Visit Location Patient:  Home Provider: Virtual Visit Location Provider: Home Office   I discussed the limitations of evaluation and management by telemedicine and the availability of in person appointments. The patient expressed understanding and agreed to proceed.    History of Present Illness: Dana Lopez is a 17 y.o. who identifies as a female who was assigned female at birth, and is being seen today for drowsiness, nausea, vomiting and diarrhea. She took her first dose of lexapro thurs night and has been sick since. She took 1.2 of a 10 mg. She has not taken it since. She is concerned about seratonin syndrome which I have reassured her she doesn't have. Marland Kitchen  HPI: HPI  Problems:  Patient Active Problem List   Diagnosis Date Noted   Anxiety and depression 08/31/2023   Morbid obesity (HCC) 10/08/2021   Encounter for initial prescription of contraceptive pills 04/10/2021   Depression 10/20/2019   Episodic tension type headache 03/24/2019   Generalized anxiety disorder 02/03/2019   Panic disorder (episodic paroxysmal anxiety) 02/03/2019   Syncope and collapse 02/03/2019   Migraine without status migrainosus, not intractable 08/26/2018   Adenotonsillar hypertrophy 03/07/2018   Sleep disorder breathing 03/07/2018   Menorrhagia with irregular cycle 07/29/2016   Essential hypertension 12/31/2015   Snoring 09/30/2015   Obesity peds (BMI >=95 percentile) 03/05/2014    Allergies:  Allergies  Allergen Reactions   Cortisone     Decreased HR, increased HA (shot only) Prednisone pills ok- per pt and mom    Adhesive [Tape] Rash   Medications:  Current Outpatient Medications:    omeprazole (PRILOSEC) 20 MG capsule, Take 1 capsule (20 mg total) by mouth in the morning., Disp: 30 capsule, Rfl: 0  Observations/Objective: Patient is well-developed, well-nourished in no acute distress.  Resting comfortably  at home.  Head is normocephalic, atraumatic.  No labored breathing.  Speech is clear and coherent with  logical content.  Patient is alert and oriented at baseline.    Assessment and Plan: 1. Nausea and vomiting, unspecified vomiting type  2. Anxiety and depression  3. Panic disorder (episodic paroxysmal anxiety)  Increase fluids, follow up with pcp this week, UC or ED if sx worsen. Reassured that was not enough meds to cause this. Push fluids. Probably due to viral infection vs anxiety.   Follow Up Instructions: I discussed the assessment and treatment plan with the patient. The patient was provided an opportunity to ask questions and all were answered. The patient agreed with the plan and demonstrated an understanding of the instructions.  A copy of instructions were sent to the patient via MyChart unless otherwise noted below.     The patient was advised to call back or seek an in-person evaluation if the symptoms worsen or if the condition fails to improve as anticipated.    Dana Curio, FNP

## 2023-10-10 NOTE — Discharge Instructions (Signed)
You were seen today for fatigue, nausea vomiting and diarrhea.  While you were here we checked labs as well as an electrocardiogram.  This did not show any acute life-threatening cause of your symptoms or any life-threatening complications from throwing up over the past 3 days.  You likely have a viral illness that will resolve on its own within several days with supportive care.  I am sending you with a prescription for medication to help with nausea and vomiting.  Please pick it up and take it up to 3 times a day as needed for your symptoms.  Take it 20 to 30 minutes before trying to eat.  Please eat foods that are gentle in your stomach.  Avoid spicy or fatty foods until your symptoms resolved.  Follow-up with your PCP in 2 to 3 days for repeat evaluation.  If you develop fevers or abdominal pain, blood in your vomit or stool, lightheadedness or dizziness, or any new or concerning symptoms return to the ED.

## 2023-10-10 NOTE — ED Provider Notes (Signed)
[place] EMERGENCY DEPARTMENT AT [facility] Provider Note   CSN: [id number] Arrival date & time: 10/10/23  1737     History  Chief Complaint  Patient presents with   Emesis   Diarrhea    Dana Lopez is a 17 y.o. female here for generalized weakness and fatigue, increased sleepiness as well as nausea, vomiting, diarrhea, generalized shakiness since 3 days ago. Symptoms are all slightly worsening. No blood in stool or vomit. No fevers but does have chills. No chest pain, SOB. No dysuria hematuria. No abdominal pain. No vaginal discharges, itching, pain. LMP was 09/23/23. No birth control. No other affected family members. No raw food ingestion, no camping or creek/lake exposure, no travel. Not eaten food since 3 days ago. Drinking water, gatorade, ginger ale. Not drinking very much. Vomiting 2-3x/day. Diarrhea is 5-10 per day. No recent abx or hospitalizations.  Started lexapro 3 days ago but stopped because of these symptoms, and also "feeling high but bad."   Had labs at her PCP on 11/8 for fatigue including CBC, CMP, thyroid, lipids, vitamin D, AM cortisol, A1C. Vitamin D low at 21.5, otherwise relatively unremarkable.  The history is provided by the patient.       Home Medications Prior to Admission medications   Medication Sig Start Date End Date Taking? Authorizing Provider  ondansetron (ZOFRAN) 4 MG tablet Take 1 tablet (4 mg total) by mouth every 6 (six) hours. 10/10/23  Yes Karmen Stabs, MD  omeprazole (PRILOSEC) 20 MG capsule Take 1 capsule (20 mg total) by mouth in the morning. 09/29/23   Raliegh Ip, DO      Allergies    Cortisone and Adhesive [tape]    Review of Systems   Review of Systems per HPI  Physical Exam Updated Vital Signs BP 119/67   Pulse (!) 56   Temp 97.7 F (36.5 C) (Oral)   Resp 17   Ht 5\' 6"  (1.676 m)   Wt (!) 141.5 kg   LMP 09/20/2023   SpO2 100%   BMI 50.36 kg/m  Physical Exam Constitutional:      General:  She is not in acute distress.    Appearance: Normal appearance. She is obese. She is not ill-appearing.  HENT:     Head: Normocephalic and atraumatic.     Nose: Nose normal.     Mouth/Throat:     Mouth: Mucous membranes are dry.     Pharynx: No oropharyngeal exudate or posterior oropharyngeal erythema.  Eyes:     Extraocular Movements: Extraocular movements intact.     Pupils: Pupils are equal, round, and reactive to light.  Cardiovascular:     Rate and Rhythm: Normal rate and regular rhythm.     Pulses: Normal pulses.     Heart sounds: Normal heart sounds.  Pulmonary:     Effort: Pulmonary effort is normal.     Breath sounds: Normal breath sounds.  Abdominal:     General: There is no distension.     Palpations: Abdomen is soft.     Tenderness: There is no abdominal tenderness. There is no right CVA tenderness, left CVA tenderness or guarding. Negative signs include Murphy's sign and McBurney's sign.  Musculoskeletal:     Cervical back: Neck supple.     Right lower leg: No edema.     Left lower leg: No edema.  Skin:    General: Skin is warm and dry.     Capillary Refill: Capillary refill takes less than 2  seconds.     Coloration: Skin is not pale.  Neurological:     General: No focal deficit present.     Mental Status: She is alert.     Cranial Nerves: No cranial nerve deficit.     Sensory: No sensory deficit.     Motor: No weakness.     Coordination: Coordination normal.     ED Results / Procedures / Treatments   Labs (all labs ordered are listed, but only abnormal results are displayed) Labs Reviewed  BASIC METABOLIC PANEL - Abnormal; Notable for the following components:      Result Value   Glucose, Bld 102 (*)    Creatinine, Ser 1.08 (*)    All other components within normal limits  CBC - Abnormal; Notable for the following components:   WBC 12.1 (*)    All other components within normal limits  HEPATIC FUNCTION PANEL - Abnormal; Notable for the following  components:   Albumin 3.4 (*)    All other components within normal limits  HCG, SERUM, QUALITATIVE  URINALYSIS, ROUTINE W REFLEX MICROSCOPIC  CBG MONITORING, ED    EKG None  Radiology No results found.  Procedures Procedures    Medications Ordered in ED Medications  ondansetron (ZOFRAN) injection 4 mg (4 mg Intravenous Given 10/10/23 1851)  lactated ringers bolus 1,000 mL (0 mLs Intravenous Stopped 10/10/23 2248)  lactated ringers bolus 1,000 mL (0 mLs Intravenous Stopped 10/10/23 2248)  ketorolac (TORADOL) 15 MG/ML injection 15 mg (15 mg Intravenous Given 10/10/23 2105)  droperidol (INAPSINE) 2.5 MG/ML injection 1.25 mg (1.25 mg Intravenous Given 10/10/23 2105)  diphenhydrAMINE (BENADRYL) injection 25 mg (25 mg Intravenous Given 10/10/23 2105)    ED Course/ Medical Decision Making/ A&P                                 Medical Decision Making Amount and/or Complexity of Data Reviewed Labs: ordered. Decision-making details documented in ED Course.  Risk Prescription drug management.   17 year old female presents for generalized weakness, nausea vomiting diarrhea, and fatigue.  Differential considered includes acute abdominal surgical process such as cholecystitis, appendicitis, diverticulitis, or bowel obstruction; UTI or pyelonephritis, PID or TOA, ovarian torsion, infectious colitis, gastroenteritis, sequelae of ongoing nausea vomiting diarrhea such as dehydration or electrolyte metabolic derangement.  She is initially hemodynamically stable afebrile in no acute distress. She appears mildly dehydrated on physical exam.  Her abdomen is nontender nondistended with no peritoneal signs, negative Murphy sign, given that she does not have any abdominal pain along with this reassuring exam doubt acute surgical pathology that would warrant advanced imaging.  She does not have any gynecologic symptoms, lower pelvic pain, no fevers; exceedingly low suspicion for PID or ovarian torsion.  Given no fevers or abdominal pain or bloody stools or risk factors I doubt infectious/bacterial or inflammatory colitis is cause of her symptoms.   Labs obtained independently reviewed and notable for normal glucose of 95, creatinine of 1.08 which is relativley unchanged from 2 days ago 1.04 (and prior to this 0.86-0.94), normal electrolytes, no evidence of contraction alkalosis, unremarkable LFTs without evidence of biliary obstruction or hepatitis.  She has a mild leukocytosis of 12.1 which could be related to inflammatory response with her symptoms or could be related to an underlying gastroenteritis picture.  On her reassuring exam described above do not suspect it is related to an underlying surgical or emergent process.  UA unable to  provide sample, but based on lack of urinary symptoms doubt UTI.   Patient was given a liter of fluids as well as 4 mg of IV Zofran in the ED.  She was p.o. challenged and able to take PO.  Based on above workup and evaluation suspicion for acute life-threatening pathology is exceedingly low.  Suspect viral gastroenteritis or other benign etiology for symptoms.  Zofran was sent to her preferred pharmacy.  Discussed fluid hydration at home and avoidance of spicy or fatty foods until symptoms resolved.  Strict return precautions were discussed.  PCP follow-up was recommended to her.  She was discharged in stable condition from the ED.Marland Kitchen        Final Clinical Impression(s) / ED Diagnoses Final diagnoses:  Nausea vomiting and diarrhea  Fatigue, unspecified type    Rx / DC Orders ED Discharge Orders          Ordered    ondansetron (ZOFRAN) 4 MG tablet  Every 6 hours        10/10/23 2258              Karmen Stabs, MD 10/10/23 2324    Linwood Dibbles, MD 10/12/23 [phone]

## 2023-10-10 NOTE — ED Triage Notes (Signed)
Pt arrives with c/o n/v/d that started Thursday. Pt endorses generalized weakness and dizziness. Pt denies fevers or ABD pain.

## 2023-10-11 ENCOUNTER — Telehealth: Payer: Self-pay

## 2023-10-11 LAB — CBC WITH DIFFERENTIAL/PLATELET
Basophils Absolute: 0.1 10*3/uL (ref 0.0–0.2)
Basos: 1 %
EOS (ABSOLUTE): 0.1 10*3/uL (ref 0.0–0.4)
Eos: 1 %
Hematocrit: 43 % (ref 34.0–46.6)
Hemoglobin: 13.4 g/dL (ref 11.1–15.9)
Immature Grans (Abs): 0 10*3/uL (ref 0.0–0.1)
Immature Granulocytes: 0 %
Lymphocytes Absolute: 2 10*3/uL (ref 0.7–3.1)
Lymphs: 26 %
MCH: 26.5 pg — ABNORMAL LOW (ref 26.6–33.0)
MCHC: 31.2 g/dL — ABNORMAL LOW (ref 31.5–35.7)
MCV: 85 fL (ref 79–97)
Monocytes Absolute: 0.6 10*3/uL (ref 0.1–0.9)
Monocytes: 8 %
Neutrophils Absolute: 5.1 10*3/uL (ref 1.4–7.0)
Neutrophils: 64 %
Platelets: 412 10*3/uL (ref 150–450)
RBC: 5.05 x10E6/uL (ref 3.77–5.28)
RDW: 13.3 % (ref 11.7–15.4)
WBC: 7.9 10*3/uL (ref 3.4–10.8)

## 2023-10-11 LAB — LIPID PANEL
Chol/HDL Ratio: 4.6 ratio — ABNORMAL HIGH (ref 0.0–4.4)
Cholesterol, Total: 197 mg/dL — ABNORMAL HIGH (ref 100–169)
HDL: 43 mg/dL (ref >39)
LDL Chol Calc (NIH): 142 mg/dL — ABNORMAL HIGH (ref 0–109)
Triglycerides: 66 mg/dL (ref 0–89)
VLDL Cholesterol Cal: 12 mg/dL (ref 5–40)

## 2023-10-11 LAB — CMP14+EGFR
ALT: 22 IU/L (ref 0–32)
AST: 22 IU/L (ref 0–40)
Albumin: 4 g/dL (ref 4.0–5.0)
Alkaline Phosphatase: 125 IU/L — ABNORMAL HIGH (ref 42–106)
BUN/Creatinine Ratio: 10 (ref 9–23)
BUN: 10 mg/dL (ref 6–20)
Bilirubin Total: 0.4 mg/dL (ref 0.0–1.2)
CO2: 22 mmol/L (ref 20–29)
Calcium: 9.9 mg/dL (ref 8.7–10.2)
Chloride: 102 mmol/L (ref 96–106)
Creatinine, Ser: 1.04 mg/dL — ABNORMAL HIGH (ref 0.57–1.00)
Globulin, Total: 3.2 g/dL (ref 1.5–4.5)
Glucose: 81 mg/dL (ref 70–99)
Potassium: 4.5 mmol/L (ref 3.5–5.2)
Sodium: 142 mmol/L (ref 134–144)
Total Protein: 7.2 g/dL (ref 6.0–8.5)
eGFR: 79 mL/min/{1.73_m2} (ref >59)

## 2023-10-11 LAB — CORTISOL-AM, BLOOD: Cortisol - AM: 16.5 ug/dL (ref 6.2–19.4)

## 2023-10-11 LAB — VITAMIN D 25 HYDROXY (VIT D DEFICIENCY, FRACTURES): Vit D, 25-Hydroxy: 21.5 ng/mL — ABNORMAL LOW (ref 30.0–100.0)

## 2023-10-11 LAB — T4, FREE: Free T4: 1.57 ng/dL (ref 0.93–1.60)

## 2023-10-11 LAB — TSH: TSH: 1.67 u[IU]/mL (ref 0.450–4.500)

## 2023-10-11 NOTE — Transitions of Care (Post Inpatient/ED Visit) (Cosign Needed)
   10/11/2023  Name: Dana Lopez MRN: [id number] DOB: 2006-05-31  Today's TOC FU Call Status: Today's TOC FU Call Status:: Successful TOC FU Call Completed TOC FU Call Complete Date: 10/11/23 Patient's Name and Date of Birth confirmed.  Transition Care Management Follow-up Telephone Call Date of Discharge: 10/10/23 Discharge Facility: [facility]) Type of Discharge: Emergency Department Reason for ED Visit: Other: How have you been since you were released from the hospital?: Better (I just feel really tired) Any questions or concerns?: No  Items Reviewed: Did you receive and understand the discharge instructions provided?: Yes Medications obtained,verified, and reconciled?: Yes (Medications Reviewed) Any new allergies since your discharge?: No Dietary orders reviewed?: NA Do you have support at home?: Yes People in Home: parent(s)  Medications Reviewed Today: Medications Reviewed Today     Reviewed by Helder Crisafulli, Jordan Hawks, CMA (Certified Medical Assistant) on 10/11/23 at 1647  Med List Status: <None>   Medication Order Taking? Sig Documenting Provider Last Dose Status Informant  omeprazole (PRILOSEC) 20 MG capsule [id number]  Take 1 capsule (20 mg total) by mouth in the morning. Delynn Flavin M, DO  Active   ondansetron (ZOFRAN) 4 MG tablet [id number]  Take 1 tablet (4 mg total) by mouth every 6 (six) hours. Karmen Stabs, MD  Active             Home Care and Equipment/Supplies: Were Home Health Services Ordered?: NA Any new equipment or medical supplies ordered?: NA  Functional Questionnaire: Do you need assistance with bathing/showering or dressing?: No Do you need assistance with meal preparation?: No Do you need assistance with eating?: No Do you have difficulty maintaining continence: No Do you need assistance with getting out of bed/getting out of a chair/moving?: No Do you have difficulty managing or taking your medications?: No  Follow up appointments  reviewed: PCP Follow-up appointment confirmed?: Yes Date of PCP follow-up appointment?: 10/13/23 Follow-up Provider: Dr. Louanne Skye (pcp out of office this week) Specialist Hospital Follow-up appointment confirmed?: NA Do you need transportation to your follow-up appointment?: No Do you understand care options if your condition(s) worsen?: Yes-patient verbalized understanding     Jaxiel Kines, CMA  [facility] AWV Team Direct Dial: [phone]

## 2023-10-11 NOTE — Telephone Encounter (Signed)
It looks like pt saw Dois Davenport for genesight testing. Will route to [place] for her to review results on genesight and advise.

## 2023-10-12 ENCOUNTER — Encounter: Payer: Self-pay | Admitting: Family Medicine

## 2023-10-13 ENCOUNTER — Encounter: Payer: Self-pay | Admitting: Family Medicine

## 2023-10-13 ENCOUNTER — Ambulatory Visit: Payer: BC Managed Care – PPO

## 2023-10-13 DIAGNOSIS — F3181 Bipolar II disorder: Secondary | ICD-10-CM | POA: Diagnosis not present

## 2023-10-20 ENCOUNTER — Inpatient Hospital Stay: Payer: BC Managed Care – PPO | Admitting: Family Medicine

## 2023-10-21 ENCOUNTER — Encounter: Payer: Self-pay | Admitting: Family Medicine

## 2023-10-21 DIAGNOSIS — F422 Mixed obsessional thoughts and acts: Secondary | ICD-10-CM | POA: Diagnosis not present

## 2023-10-27 DIAGNOSIS — F422 Mixed obsessional thoughts and acts: Secondary | ICD-10-CM | POA: Diagnosis not present

## 2023-11-01 ENCOUNTER — Encounter: Payer: Self-pay | Admitting: Family Medicine

## 2023-11-01 ENCOUNTER — Ambulatory Visit (INDEPENDENT_AMBULATORY_CARE_PROVIDER_SITE_OTHER): Payer: BC Managed Care – PPO | Admitting: Family Medicine

## 2023-11-01 VITALS — BP 127/89 | HR 74 | Temp 98.6°F | Ht 66.0 in | Wt 307.6 lb

## 2023-11-01 DIAGNOSIS — Z713 Dietary counseling and surveillance: Secondary | ICD-10-CM

## 2023-11-01 DIAGNOSIS — R1013 Epigastric pain: Secondary | ICD-10-CM | POA: Diagnosis not present

## 2023-11-01 DIAGNOSIS — F419 Anxiety disorder, unspecified: Secondary | ICD-10-CM | POA: Diagnosis not present

## 2023-11-01 DIAGNOSIS — F32A Depression, unspecified: Secondary | ICD-10-CM

## 2023-11-01 MED ORDER — WEGOVY 2.4 MG/0.75ML ~~LOC~~ SOAJ: 2.4000 mg | SUBCUTANEOUS | 0 refills | Status: DC

## 2023-11-01 MED ORDER — WEGOVY 1.7 MG/0.75ML ~~LOC~~ SOAJ: 1.7000 mg | SUBCUTANEOUS | 0 refills | Status: DC

## 2023-11-01 MED ORDER — WEGOVY 1 MG/0.5ML ~~LOC~~ SOAJ: 1.0000 mg | SUBCUTANEOUS | 0 refills | Status: DC

## 2023-11-01 MED ORDER — FLUOXETINE HCL 20 MG PO CAPS: 20.0000 mg | ORAL_CAPSULE | Freq: Every day | ORAL | 3 refills | Status: DC

## 2023-11-01 MED ORDER — WEGOVY 0.25 MG/0.5ML ~~LOC~~ SOAJ: 0.2500 mg | SUBCUTANEOUS | 0 refills | Status: DC

## 2023-11-01 MED ORDER — WEGOVY 0.5 MG/0.5ML ~~LOC~~ SOAJ: 0.5000 mg | SUBCUTANEOUS | 0 refills | Status: DC

## 2023-11-01 NOTE — Patient Instructions (Signed)
Tips for success with Wegovy (and by success, how not to be super sick on your stomach): Eat small meals AVOID heavy foods (fried/ high in carbs like bread, pasta, rice) AVOID carbonated beverages (soda/ beer, as these can increase bloating) DOUBLE your water intake (will help you avoid constipation/ dehydration)  ZOXWRU CAN cause: Nausea Abdominal pain Increased acid reflux (sometimes presents as "sour burps") Constipation OR Diarrhea Fatigue (especially when you first start it)

## 2023-11-01 NOTE — Progress Notes (Signed)
Subjective: CC: Follow-up GI pain PCP: Dana Ip, DO NFA:OZHYQM Dana Lopez is a 17 y.o. female presenting to clinic today for:  1.  Abdominal pain Patient reports that her abdominal pain really has not changed at all with the Prilosec.  She was actually seen in the ER for nausea, vomiting and generalized sensation of not feeling well.  This evaluation was unremarkable.  She is ready to see gastroenterology for this issue  2.  Anxiety and depression Patient notes that the Celexa made her feel weird and she discontinued it after 1 dose.  She is willing to get back on Prozac as this was helpful for her at 1 point but just became ineffective after several years of use.  She has been seeing her therapist for 2 weeks now really has not noticed a huge impact yet.  Considering seeing a psychiatrist but not sure.  Would like to give this SSRI another go before deciding.  3.  Morbid obesity She is interested in pursuing [phone].  She notes her insurance will cover it.  She confirmed this with her mother.  She is not pregnancy planning at this time. Patient's last menstrual period was 10/27/2023.  To summarize she has tried diet modification, physical exercise.  She has seen a nutritionist.  She has tried a couple of diets in the past but nothing really has aided in weight loss.  She has anxiety depression as above.  She suffers from some intermittent hip and knee pains.    ROS: Per HPI  Allergies  Allergen Reactions   Cortisone     Decreased HR, increased HA (shot only) Prednisone pills ok- per pt and mom    Adhesive [Tape] Rash   Past Medical History:  Diagnosis Date   Allergy    Anxiety    Migraine     Current Outpatient Medications:    FLUoxetine (PROZAC) 20 MG capsule, Take 1 capsule (20 mg total) by mouth daily., Disp: 90 capsule, Rfl: 3   omeprazole (PRILOSEC) 20 MG capsule, Take 1 capsule (20 mg total) by mouth in the morning., Disp: 30 capsule, Rfl: 0   ondansetron (ZOFRAN)  4 MG tablet, Take 1 tablet (4 mg total) by mouth every 6 (six) hours., Disp: 12 tablet, Rfl: 0   Semaglutide-Weight Management (WEGOVY) 0.25 MG/0.5ML SOAJ, Inject 0.25 mg into the skin every 7 (seven) days., Disp: 2 mL, Rfl: 0   Semaglutide-Weight Management (WEGOVY) 0.5 MG/0.5ML SOAJ, Inject 0.5 mg into the skin every 7 (seven) days., Disp: 2 mL, Rfl: 0   Semaglutide-Weight Management (WEGOVY) 1 MG/0.5ML SOAJ, Inject 1 mg into the skin every 7 (seven) days., Disp: 2 mL, Rfl: 0   Semaglutide-Weight Management (WEGOVY) 1.7 MG/0.75ML SOAJ, Inject 1.7 mg into the skin every 7 (seven) days., Disp: 3 mL, Rfl: 0   Semaglutide-Weight Management (WEGOVY) 2.4 MG/0.75ML SOAJ, Inject 2.4 mg into the skin every 7 (seven) days., Disp: 3 mL, Rfl: 0 Social History   Socioeconomic History   Marital status: Single    Spouse name: Not on file   Number of children: Not on file   Years of education: Not on file   Highest education level: Not on file  Occupational History   Not on file  Tobacco Use   Smoking status: Never   Smokeless tobacco: Never  Vaping Use   Vaping status: Never Used  Substance and Sexual Activity   Alcohol use: No   Drug use: No   Sexual activity: Never  Other Topics  Concern   Not on file  Social History Narrative   Dana Lopez is an 10th grade student.   She is at [employer].   She lives with both parents.    She has two older sisters.   Social Determinants of Health   Financial Resource Strain: Not on file  Food Insecurity: Not on file  Transportation Needs: Not on file  Physical Activity: Not on file  Stress: Not on file  Social Connections: Not on file  Intimate Partner Violence: Not on file   Family History  Problem Relation Age of Onset   Crohn's disease Mother    Depression Mother    Asthma Mother    Hypertension Father     Objective: Office vital signs reviewed. BP 127/89   Pulse 74   Temp 98.6 F (37 C)   Ht 5\' 6"  (1.676 m)   Wt (!) 307 lb 9.6 oz  (139.5 kg)   LMP 10/27/2023   SpO2 100%   BMI 49.65 kg/m   Physical Examination:  General: Awake, alert, morbidly obese, No acute distress Psych: Mood somewhat depressed patient very pleasant, interactive     11/01/2023   10:40 AM 09/29/2023   10:00 AM 09/29/2023    9:56 AM  Depression screen PHQ 2/9  Decreased Interest 1  1  Down, Depressed, Hopeless 1 2 1   PHQ - 2 Score 2 2 2   Altered sleeping 1 2 1   Tired, decreased energy 2 2 1   Change in appetite 2  1  Feeling bad or failure about yourself  1  0  Trouble concentrating 2 2 0  Moving slowly or fidgety/restless 0  0  Suicidal thoughts 0  0  PHQ-9 Score 10 8 5   Difficult doing work/chores   Somewhat difficult      11/01/2023   10:41 AM 09/29/2023    9:56 AM 08/31/2023   10:02 AM 01/19/2023   11:13 AM  GAD 7 : Generalized Anxiety Score  Nervous, Anxious, on Edge 2 2 2 2   Control/stop worrying 2 2 2 2   Worry too much - different things 2 2 2 2   Trouble relaxing 2 1 2 2   Restless 2 1 2 2   Easily annoyed or irritable 2 2 2 2   Afraid - awful might happen 2 2 2  0  Total GAD 7 Score 14 12 14 12   Anxiety Difficulty Somewhat difficult Somewhat difficult Somewhat difficult Somewhat difficult     Assessment/ Plan: 17 y.o. female   Epigastric pain - Plan: Ambulatory referral to Gastroenterology  Anxiety and depression - Plan: FLUoxetine (PROZAC) 20 MG capsule, Semaglutide-Weight Management (WEGOVY) 0.25 MG/0.5ML SOAJ, Semaglutide-Weight Management (WEGOVY) 0.5 MG/0.5ML SOAJ, Semaglutide-Weight Management (WEGOVY) 1 MG/0.5ML SOAJ, Semaglutide-Weight Management (WEGOVY) 1.7 MG/0.75ML SOAJ, Semaglutide-Weight Management (WEGOVY) 2.4 MG/0.75ML SOAJ  Morbid obesity (HCC) - Plan: Semaglutide-Weight Management (WEGOVY) 0.25 MG/0.5ML SOAJ, Semaglutide-Weight Management (WEGOVY) 0.5 MG/0.5ML SOAJ, Semaglutide-Weight Management (WEGOVY) 1 MG/0.5ML SOAJ, Semaglutide-Weight Management (WEGOVY) 1.7 MG/0.75ML SOAJ, Semaglutide-Weight  Management (WEGOVY) 2.4 MG/0.75ML SOAJ  Weight loss counseling, encounter for  Referral to gastroenterology for further evaluation of epigastric pain that is refractory to PPI, diet modification and has a negative workup thus far.  Has not returned H. pylori or fecal occult blood testing yet  Start Prozac.  We went over her GeneSight testing today.  This will be scanned into her chart.  Would like to see her back in 6 weeks for recheck, sooner if concerns arise.  Continue follow-up with counseling.  Offered referral to  psychiatry but she would like to hold off on this for now  She is interested in starting GLP I think this is appropriate given her BMI and multiple failures with diet and exercise.  She has had a totally negative metabolic workup thus far.  We discussed possible side effects including nausea, vomiting, increased abdominal pain, diarrhea etc.  Handout provided.  Backup method of contraception if sexually active recommended.  Will see her back in 3 months for in person weight recheck, sooner if concerns arise   Dana Blades Hulen Skains, DO [facility] [phone]

## 2023-11-02 ENCOUNTER — Telehealth: Payer: Self-pay

## 2023-11-02 NOTE — Telephone Encounter (Signed)
Lidia Donzetta Matters (KeyAudree Camel) Rx #: [id number] [phone] 0.25MG /0.5ML auto-injectors Form [employer] [place] Commercial Electronic Request Form Created 1 day ago Sent to Plan 4 minutes ago Plan Response 4 minutes ago Submit Clinical Questions less than a minute ago Determination Wait for Determination Please wait for [employer] [place] Commercial MHK [id number] to return a determination.

## 2023-11-03 DIAGNOSIS — F422 Mixed obsessional thoughts and acts: Secondary | ICD-10-CM | POA: Diagnosis not present

## 2023-11-04 ENCOUNTER — Other Ambulatory Visit (HOSPITAL_COMMUNITY): Payer: Self-pay

## 2023-11-04 NOTE — Telephone Encounter (Signed)
Pharmacy Patient Advocate Encounter  Received notification from [facility] that Prior Authorization for Wegovy 0.25MG /0.5ML auto-injectors has been APPROVED from 11/04/23 to 03/07/24. Ran test claim, Copay is $0. This test claim was processed through [facility]- copay amounts may vary at other pharmacies due to pharmacy/plan contracts, or as the patient moves through the different stages of their insurance plan.   PA #/Case ID/Reference #: [id number]

## 2023-11-10 ENCOUNTER — Encounter: Payer: Self-pay | Admitting: Nurse Practitioner

## 2023-11-12 ENCOUNTER — Encounter: Payer: Self-pay | Admitting: Family Medicine

## 2023-11-12 DIAGNOSIS — J358 Other chronic diseases of tonsils and adenoids: Secondary | ICD-10-CM

## 2023-11-17 DIAGNOSIS — F422 Mixed obsessional thoughts and acts: Secondary | ICD-10-CM | POA: Diagnosis not present

## 2023-11-22 DIAGNOSIS — F422 Mixed obsessional thoughts and acts: Secondary | ICD-10-CM | POA: Diagnosis not present

## 2023-11-26 ENCOUNTER — Encounter (INDEPENDENT_AMBULATORY_CARE_PROVIDER_SITE_OTHER): Payer: Self-pay | Admitting: Otolaryngology

## 2023-12-01 NOTE — Progress Notes (Addendum)
 Referring Provider: Jolinda Norene HERO, DO  Primary Care Physician:  Jolinda Norene HERO, DO Primary Gastroenterologist:  Dr. Eartha  Chief Complaint  Patient presents with   Abdominal Pain    Stomach pains, nausea and vomiting. Stomach cramps after eating. Has liquid stools most of the times. Mother has Crohn's diease     HPI:   Dana Lopez is a 18 y.o. female presenting today at the request of Jolinda Norene M, DO for epigastric pain.  Reviewed recent office visits with Dr. Jolinda.  On October 30, patient was reporting epigastric pain sometimes radiating to her chest.  Occasionally relieved by Tums, but not always.  Stools were always runny and were dark.  No bright red blood.  Intermittent nausea and vomiting.  Mother with history of Crohn's disease.  At that visit, was started on omeprazole  20 mg daily and had blood work that was remarkable for slight elevation of alk phos at 125, low vitamin D  of 21.5.  TSH, free T4, a.m. cortisol, hemoglobin A1c, hemoglobin all within normal limits.  She did not complete fecal occult blood or H. pylori stool test.   Follow-up with Dr. Jolinda 12/2 reporting no change in abdominal pain with Prilosec.  She had recently been seen in the ER on November 10 for nausea, vomiting.  Laboratory evaluation remarkable for WBC 12.1, creatinine 1.08.  Alk phos normalized.  Patient was ready to see GI and referral was placed.  Notably, patient was also started on Wegovy  for weight management at that visit.  Today:  Dating back to at least the age of 71, she has had issues with her stomach.  She reports having essentially constant abdominal pain with some days better than others.  She has a hard time eating.  States she is either having trouble with vomiting or food goes right through me.  She is nauseated every day, but only tends to vomit when her abdominal pain is more severe.  Has early satiety and postprandial bloating stating that her stomach  will get hard as a rock.  Her abdominal pain is all over, but when it worsens, it is most severe in the mid to low abdomen.  Pain does get worse with meals.  No improvement with a bowel movement.  Bowel movements are throughout the day, but frequently after meals.  She cannot tell me the last time she has had a normal bowel movement.  Reports having more than 5 watery bowel movements per day.  Occasional nocturnal stools.  No prior stool testing.  No brbp or melena. Stools can be dark.  States she is always tired.  States she hears a lot of sloshing around in her stomach.  Reports it sounds like I have an alien in my stomach.  Has a lot of heartburn, currently taking omeprazole  20 mg daily. Especially at night. No late eating.  Avoids fried, fatty, spicy foods.  NSAIDs: Rare ibuprofen  as it makes her stomach upset.   Mom has severe Crohn's. Has required surgery.  Follows with a specialist in [place].  Cousin with Crohn's disease and celiac.    Past Medical History:  Diagnosis Date   Allergy    Anxiety    Migraine     History reviewed. No pertinent surgical history.  Current Outpatient Medications  Medication Sig Dispense Refill   FLUoxetine  (PROZAC ) 20 MG capsule Take 1 capsule (20 mg total) by mouth daily. 90 capsule 3   omeprazole  (PRILOSEC) 40 MG capsule Take 1 capsule (40 mg  total) by mouth daily. 30 capsule 3   ondansetron  (ZOFRAN ) 4 MG tablet Take 1 tablet (4 mg total) by mouth every 6 (six) hours. 12 tablet 0   ondansetron  (ZOFRAN -ODT) 4 MG disintegrating tablet Take 1 tablet (4 mg total) by mouth every 8 (eight) hours as needed for nausea or vomiting. 30 tablet 1   Semaglutide -Weight Management (WEGOVY ) 0.25 MG/0.5ML SOAJ Inject 0.25 mg into the skin every 7 (seven) days. (Patient not taking: Reported on 12/02/2023) 2 mL 0   Semaglutide -Weight Management (WEGOVY ) 0.5 MG/0.5ML SOAJ Inject 0.5 mg into the skin every 7 (seven) days. (Patient not taking: Reported on 12/02/2023) 2 mL 0    Semaglutide -Weight Management (WEGOVY ) 1 MG/0.5ML SOAJ Inject 1 mg into the skin every 7 (seven) days. (Patient not taking: Reported on 12/02/2023) 2 mL 0   Semaglutide -Weight Management (WEGOVY ) 1.7 MG/0.75ML SOAJ Inject 1.7 mg into the skin every 7 (seven) days. (Patient not taking: Reported on 12/02/2023) 3 mL 0   Semaglutide -Weight Management (WEGOVY ) 2.4 MG/0.75ML SOAJ Inject 2.4 mg into the skin every 7 (seven) days. (Patient not taking: Reported on 12/02/2023) 3 mL 0   No current facility-administered medications for this visit.    Allergies as of 12/02/2023 - Review Complete 12/02/2023  Allergen Reaction Noted   Cortisone  07/30/2021   Adhesive [tape] Rash 09/11/2019    Family History  Problem Relation Age of Onset   Crohn's disease Mother    Depression Mother    Asthma Mother    Hypertension Father     Social History   Socioeconomic History   Marital status: Single    Spouse name: Not on file   Number of children: Not on file   Years of education: Not on file   Highest education level: Not on file  Occupational History   Not on file  Tobacco Use   Smoking status: Never   Smokeless tobacco: Never  Vaping Use   Vaping status: Never Used  Substance and Sexual Activity   Alcohol  use: No   Drug use: No   Sexual activity: Never  Other Topics Concern   Not on file  Social History Narrative   [name] is an 10th grade student.   She is at [employer].   She lives with both parents.    She has two older sisters.   Social Drivers of Corporate Investment Banker Strain: Not on file  Food Insecurity: Not on file  Transportation Needs: Not on file  Physical Activity: Not on file  Stress: Not on file  Social Connections: Not on file  Intimate Partner Violence: Not on file    Review of Systems: Gen: Denies any fever, chills, cold or flulike symptoms.  CV: Denies chest pain, heart palpitations. Resp: Admits to occasional shortness of breath.  No cough. GI: See  HPI GU : Denies urinary burning, urinary frequency, urinary hesitancy MS: Reports her body hurts all over. Derm: Reports she is prone to getting rashes very easily. Psych: Denies depression, anxiety. Heme: See HPI  Physical Exam: BP 113/74 (BP Location: Right Arm, Patient Position: Sitting, Cuff Size: Large)   Pulse 61   Temp 97.7 F (36.5 C) (Temporal)   Ht 5' 7 (1.702 m)   Wt (!) 302 lb 3.2 oz (137.1 kg)   LMP 10/26/2023 (Approximate)   BMI 47.33 kg/m  General:   Alert and oriented. Pleasant and cooperative. Well-nourished and well-developed.  Head:  Normocephalic and atraumatic. Eyes:  Without icterus, sclera clear and conjunctiva  pink.  Ears:  Normal auditory acuity. Lungs:  Clear to auscultation bilaterally. No wheezes, rales, or rhonchi. No distress.  Heart:  S1, S2 present without murmurs appreciated.  Abdomen:  +BS, soft, and non-distended.  Mild TTP across lower abdomen.  No HSM noted. No guarding or rebound. No masses appreciated.  Rectal:  Deferred  Msk:  Symmetrical without gross deformities. Normal posture. Extremities:  Without edema. Neurologic:  Alert and  oriented x4;  grossly normal neurologically. Skin:  Intact without significant lesions or rashes. Psych:  Normal mood and affect.    Assessment:  18 year old female with history of anxiety, migraines, presenting today at the request of Dr. Norene Fielding for further evaluation of abdominal pain.  Patient has numerous GI complaints including about 6 years of generalized abdominal pain, watery diarrhea, nausea, vomiting, early satiety, bloating. Also with uncontrolled GERD. Denies brbpr, melena. She does have generalized body pain, intermittent rash, and reports mother with history of severe Crohn's disease.  Recent labs have shown normal LFTs, hemoglobin.  She was found to have mildly elevated white count of 12.1 on 11/10.  TSH hemoglobin A1c, and a.m. cortisol recently within normal limits.  Last abdominal  imaging in March 2022 with CT A/P with contrast that showed no acute abnormalities.  Patient states her symptoms were the same back then.  On exam today, she only has mild tenderness palpation across her lower abdomen.  Differentials for her current symptoms are broad including IBD, IBS, infectious diarrhea, gastritis, duodenitis, PUD, H. Pylori, gastroparesis, celiac disease, biliary etiology.   I have recommended starting with checking labs to rule out celiac disease, infectious diarrhea.  I will also check CRP, sed rate, fecal calprotectin.  For her uncontrolled GERD, I am increasing omeprazole  to 40 mg daily.  Also providing Zofran  to use as needed for nausea/vomiting.  If labs and stool testing are unrevealing, she would likely need EGD and colonoscopy for further evaluation.   Plan:  C. difficile toxin assay, GI profile panel, IgA, TTG IgA, CRP, sed rate, fecal calprotectin. Increase omeprazole  to 40 mg daily. Zofran  4 mg every 8 hours as needed. If blood work and stool testing are unrevealing, we will proceed with EGD and colonoscopy for further evaluation. Could consider trial of dicyclomine while awaiting procedures.  Recommended holding off on starting Wegovy  while we are working up GI problems as this may worsen her symptoms. Notably, Wegovy  was only for weight loss.    Josette Centers, PA-C [facility] Gastroenterology 12/02/2023   I have reviewed the note and agree with the APP's assessment as described in this progress note  Toribio Fortune, MD Gastroenterology and Hepatology [facility] Gastroenter[facility]

## 2023-12-02 ENCOUNTER — Encounter: Payer: Self-pay | Admitting: Gastroenterology

## 2023-12-02 ENCOUNTER — Ambulatory Visit: Payer: BC Managed Care – PPO | Admitting: Gastroenterology

## 2023-12-02 VITALS — BP 113/74 | HR 61 | Temp 97.7°F | Ht 67.0 in | Wt 302.2 lb

## 2023-12-02 DIAGNOSIS — R1084 Generalized abdominal pain: Secondary | ICD-10-CM | POA: Diagnosis not present

## 2023-12-02 DIAGNOSIS — R197 Diarrhea, unspecified: Secondary | ICD-10-CM

## 2023-12-02 DIAGNOSIS — R112 Nausea with vomiting, unspecified: Secondary | ICD-10-CM

## 2023-12-02 DIAGNOSIS — K219 Gastro-esophageal reflux disease without esophagitis: Secondary | ICD-10-CM

## 2023-12-02 DIAGNOSIS — R6881 Early satiety: Secondary | ICD-10-CM

## 2023-12-02 DIAGNOSIS — R140 Abdominal distension (gaseous): Secondary | ICD-10-CM

## 2023-12-02 MED ORDER — OMEPRAZOLE 40 MG PO CPDR: 40.0000 mg | DELAYED_RELEASE_CAPSULE | Freq: Every day | ORAL | 3 refills | Status: DC

## 2023-12-02 MED ORDER — ONDANSETRON 4 MG PO TBDP: 4.0000 mg | ORAL_TABLET | Freq: Three times a day (TID) | ORAL | 1 refills | Status: DC | PRN

## 2023-12-02 NOTE — Patient Instructions (Signed)
 Please have labs and stool testing completed at [facility]/your primary care provider's office.  Increase omeprazole  to 40 mg daily 30 minutes before breakfast.  I am sending in Zofran  disintegrating tablet for you to use as needed every 8 hours for nausea/vomiting.  I will have further recommendations for you following your labs and stool study results.  It was nice to meet you today!  Josette Centers, PA-C [facility] Gastroenterology

## 2023-12-06 ENCOUNTER — Encounter (INDEPENDENT_AMBULATORY_CARE_PROVIDER_SITE_OTHER): Payer: Self-pay | Admitting: Otolaryngology

## 2023-12-09 ENCOUNTER — Telehealth: Payer: Self-pay | Admitting: Gastroenterology

## 2023-12-09 NOTE — Telephone Encounter (Signed)
 Can we find out if patient has had labs completed that I ordered at her office visit?  If not, she will need to have these completed ASAP.

## 2023-12-09 NOTE — Telephone Encounter (Signed)
 Spoke to pt, she informed me that she is going to PCP to have labs done, as soon as she can get in.

## 2023-12-15 DIAGNOSIS — F422 Mixed obsessional thoughts and acts: Secondary | ICD-10-CM | POA: Diagnosis not present

## 2023-12-16 ENCOUNTER — Other Ambulatory Visit: Payer: BC Managed Care – PPO

## 2023-12-31 NOTE — Telephone Encounter (Signed)
Any update on completion of lab/stool studies?  I still have received nothing.  She should not have to have an appointment to get this completed.

## 2024-01-04 ENCOUNTER — Encounter: Payer: Self-pay | Admitting: *Deleted

## 2024-01-04 NOTE — Telephone Encounter (Signed)
I have tried several time to reach pt. Voice mail not set up. Mailed a letter and lab requisitions .

## 2024-01-04 NOTE — Telephone Encounter (Signed)
 Noted

## 2024-01-07 DIAGNOSIS — F422 Mixed obsessional thoughts and acts: Secondary | ICD-10-CM | POA: Diagnosis not present

## 2024-01-13 DIAGNOSIS — F422 Mixed obsessional thoughts and acts: Secondary | ICD-10-CM | POA: Diagnosis not present

## 2024-01-26 DIAGNOSIS — F422 Mixed obsessional thoughts and acts: Secondary | ICD-10-CM | POA: Diagnosis not present

## 2024-02-03 DIAGNOSIS — F422 Mixed obsessional thoughts and acts: Secondary | ICD-10-CM | POA: Diagnosis not present

## 2024-02-17 DIAGNOSIS — F422 Mixed obsessional thoughts and acts: Secondary | ICD-10-CM | POA: Diagnosis not present

## 2024-03-03 DIAGNOSIS — F422 Mixed obsessional thoughts and acts: Secondary | ICD-10-CM | POA: Diagnosis not present

## 2024-03-06 ENCOUNTER — Encounter (HOSPITAL_COMMUNITY): Payer: Self-pay | Admitting: Radiology

## 2024-03-06 ENCOUNTER — Emergency Department (HOSPITAL_COMMUNITY)

## 2024-03-06 ENCOUNTER — Other Ambulatory Visit: Payer: Self-pay

## 2024-03-06 ENCOUNTER — Emergency Department (HOSPITAL_COMMUNITY)
Admission: EM | Admit: 2024-03-06 | Discharge: 2024-03-06 | Disposition: A | Discharge status: Home / Self Care | Attending: Emergency Medicine | Admitting: Emergency Medicine

## 2024-03-06 DIAGNOSIS — J029 Acute pharyngitis, unspecified: Secondary | ICD-10-CM | POA: Insufficient documentation

## 2024-03-06 DIAGNOSIS — J039 Acute tonsillitis, unspecified: Secondary | ICD-10-CM

## 2024-03-06 DIAGNOSIS — I889 Nonspecific lymphadenitis, unspecified: Secondary | ICD-10-CM | POA: Diagnosis not present

## 2024-03-06 DIAGNOSIS — R070 Pain in throat: Secondary | ICD-10-CM | POA: Diagnosis not present

## 2024-03-06 DIAGNOSIS — R1310 Dysphagia, unspecified: Secondary | ICD-10-CM | POA: Diagnosis not present

## 2024-03-06 LAB — CBC
HCT: 41.8 % (ref 36.0–46.0)
Hemoglobin: 12.8 g/dL (ref 12.0–15.0)
MCH: 27.3 pg (ref 26.0–34.0)
MCHC: 30.6 g/dL (ref 30.0–36.0)
MCV: 89.1 fL (ref 80.0–100.0)
Platelets: 321 10*3/uL (ref 150–400)
RBC: 4.69 MIL/uL (ref 3.87–5.11)
RDW: 14.2 % (ref 11.5–15.5)
WBC: 11.3 10*3/uL — ABNORMAL HIGH (ref 4.0–10.5)
nRBC: 0 % (ref 0.0–0.2)

## 2024-03-06 LAB — BASIC METABOLIC PANEL WITH GFR
Anion gap: 12 (ref 5–15)
BUN: 12 mg/dL (ref 6–20)
CO2: 21 mmol/L — ABNORMAL LOW (ref 22–32)
Calcium: 8.9 mg/dL (ref 8.9–10.3)
Chloride: 107 mmol/L (ref 98–111)
Creatinine, Ser: 0.95 mg/dL (ref 0.44–1.00)
GFR, Estimated: >60 mL/min (ref >60)
Glucose, Bld: 77 mg/dL (ref 70–99)
Potassium: 3.8 mmol/L (ref 3.5–5.1)
Sodium: 140 mmol/L (ref 135–145)

## 2024-03-06 LAB — HCG, SERUM, QUALITATIVE: Preg, Serum: NEGATIVE

## 2024-03-06 LAB — GROUP A STREP BY PCR: Group A Strep by PCR: NOT DETECTED

## 2024-03-06 LAB — MONONUCLEOSIS SCREEN: Mono Screen: NEGATIVE

## 2024-03-06 MED ORDER — KETOROLAC TROMETHAMINE 15 MG/ML IJ SOLN
15.0000 mg | Freq: Once | INTRAMUSCULAR | Status: AC
  Administered 2024-03-06: 15 mg via INTRAVENOUS
  Filled 2024-03-06: qty 1

## 2024-03-06 MED ORDER — SODIUM CHLORIDE 0.9 % IV BOLUS
1000.0000 mL | Freq: Once | INTRAVENOUS | Status: AC
  Administered 2024-03-06: 1000 mL via INTRAVENOUS

## 2024-03-06 MED ORDER — IOHEXOL 300 MG/ML  SOLN
75.0000 mL | Freq: Once | INTRAMUSCULAR | Status: AC | PRN
  Administered 2024-03-06: 75 mL via INTRAVENOUS

## 2024-03-06 MED ORDER — SODIUM CHLORIDE (PF) 0.9 % IJ SOLN
INTRAMUSCULAR | Status: AC
  Filled 2024-03-06: qty 50

## 2024-03-06 MED ORDER — MORPHINE SULFATE (PF) 4 MG/ML IV SOLN
4.0000 mg | Freq: Once | INTRAVENOUS | Status: AC
  Administered 2024-03-06: 4 mg via INTRAVENOUS
  Filled 2024-03-06: qty 1

## 2024-03-06 MED ORDER — LIDOCAINE VISCOUS HCL 2 % MT SOLN: 15.0000 mL | OROMUCOSAL | 0 refills | Status: DC | PRN

## 2024-03-06 NOTE — ED Triage Notes (Signed)
 Pt reports right sided throat pain starting last week but now into teeth and jaw, difficulty swallowing. Also whitish film on tongue since yesterday. Tried ibuprofen yesterday with no relief. Denies fevers

## 2024-03-06 NOTE — ED Provider Notes (Signed)
 Accepted handoff at shift change from [facility]. Please see prior provider note for more detail.   Briefly: Patient is 18 y.o.   DDX: concern for 1 week of sore throat, difficulty swallowing the last few days. Concern for PTA / RPA. Reports allergic to steroids. No sick contacts. Some hot potato voice.  Plan: I independently interpreted imaging including CT soft tissue neck which shows Tonsillitis and cervical adenitis without abscess.  No epiglottitis. . I agree with the radiologist interpretation.  She has an intolerance to steroids reported in her chart, and so discussed that there is not a lot of acute management to offer, I recommend ibuprofen, Tylenol for pain, swelling, will discharge with viscous lidocaine, encouraged ENT follow-up discussed possible tonsil removal in the future if this continues to be recurrent problem.      [place]ali 03/06/24 1610    Tilden Fossa, MD 03/06/24 2237

## 2024-03-06 NOTE — ED Provider Notes (Signed)
 [place] EMERGENCY DEPARTMENT AT [facility] Provider Note   CSN: [id number] Arrival date & time: 03/06/24  2725     History  Chief Complaint  Patient presents with   Sore Throat    Dana Lopez is a 18 y.o. female with medical history significant for allergies, anxiety and migraines.  The patient presents to the ED for evaluation of right-sided throat pain and swelling.  Reports that for the last 1 week she has had sore throat.  States that in the last 2 days her sore throat has localized to the right side of her throat.  She reports trouble swallowing, drooling, change in phonation.  Denies fevers, nausea, vomiting at home.  Denies any sick contacts.   Sore Throat       Home Medications Prior to Admission medications   Medication Sig Start Date End Date Taking? Authorizing Provider  FLUoxetine (PROZAC) 20 MG capsule Take 1 capsule (20 mg total) by mouth daily. 11/01/23   Raliegh Ip, DO  omeprazole (PRILOSEC) 40 MG capsule Take 1 capsule (40 mg total) by mouth daily. 12/02/23   Letta Median, PA-C  ondansetron (ZOFRAN) 4 MG tablet Take 1 tablet (4 mg total) by mouth every 6 (six) hours. 10/10/23   Karmen Stabs, MD  ondansetron (ZOFRAN-ODT) 4 MG disintegrating tablet Take 1 tablet (4 mg total) by mouth every 8 (eight) hours as needed for nausea or vomiting. 12/02/23   Letta Median, PA-C  Semaglutide-Weight Management (WEGOVY) 0.25 MG/0.5ML SOAJ Inject 0.25 mg into the skin every 7 (seven) days. Patient not taking: Reported on 12/02/2023 11/01/23   Raliegh Ip, DO  Semaglutide-Weight Management (WEGOVY) 0.5 MG/0.5ML SOAJ Inject 0.5 mg into the skin every 7 (seven) days. Patient not taking: Reported on 12/02/2023 11/01/23   Raliegh Ip, DO  Semaglutide-Weight Management (WEGOVY) 1 MG/0.5ML SOAJ Inject 1 mg into the skin every 7 (seven) days. Patient not taking: Reported on 12/02/2023 11/01/23   Raliegh Ip, DO  Semaglutide-Weight  Management (WEGOVY) 1.7 MG/0.75ML SOAJ Inject 1.7 mg into the skin every 7 (seven) days. Patient not taking: Reported on 12/02/2023 11/01/23   Raliegh Ip, DO  Semaglutide-Weight Management (WEGOVY) 2.4 MG/0.75ML SOAJ Inject 2.4 mg into the skin every 7 (seven) days. Patient not taking: Reported on 12/02/2023 11/01/23   Raliegh Ip, DO      Allergies    Cortisone and Adhesive [tape]    Review of Systems   Review of Systems  Constitutional:  Negative for fever.  HENT:  Positive for sore throat, trouble swallowing and voice change.   All other systems reviewed and are negative.   Physical Exam Updated Vital Signs BP 126/82 (BP Location: Right Arm)   Pulse 77   Temp 97.8 F (36.6 C) (Oral)   Resp 20   SpO2 99%  Physical Exam Vitals and nursing note reviewed.  Constitutional:      General: She is not in acute distress.    Appearance: She is well-developed. She is not toxic-appearing.  HENT:     Head: Normocephalic and atraumatic.     Mouth/Throat:     Pharynx: Posterior oropharyngeal erythema present.     Comments: Posterior oropharynx with erythema, no exudate.  Uvula is slightly deviated to the left, she does have right sided peritonsillar swelling.  She is handling secretions appropriately.  There is no drooling.  There is a change in phonation, patient does have a hot potato voice Eyes:  Conjunctiva/sclera: Conjunctivae normal.  Cardiovascular:     Rate and Rhythm: Normal rate and regular rhythm.     Heart sounds: No murmur heard. Pulmonary:     Effort: Pulmonary effort is normal. No respiratory distress.     Breath sounds: Normal breath sounds.  Abdominal:     Palpations: Abdomen is soft.     Tenderness: There is no abdominal tenderness.  Musculoskeletal:        General: No swelling.     Cervical back: Neck supple.  Skin:    General: Skin is warm and dry.     Capillary Refill: Capillary refill takes less than 2 seconds.     Coloration: Skin is not  jaundiced or pale.  Neurological:     Mental Status: She is alert and oriented to person, place, and time.  Psychiatric:        Mood and Affect: Mood normal.        Behavior: Behavior normal.     ED Results / Procedures / Treatments   Labs (all labs ordered are listed, but only abnormal results are displayed) Labs Reviewed  GROUP A STREP BY PCR  CBC  BASIC METABOLIC PANEL WITH GFR  HCG, SERUM, QUALITATIVE    EKG None  Radiology No results found.  Procedures Procedures   Medications Ordered in ED Medications - No data to display  ED Course/ Medical Decision Making/ A&P Clinical Course as of 03/06/24 0629  Mon Mar 06, 2024  0625 1 week of sore throat, difficulty swallowing the last few days. Concern for PTA / RPA. Reports allergic to steroids. No sick contacts. Some hot potato voice. [CP]    Clinical Course User Index [CP] Olene Floss, PA-C   Medical Decision Making Amount and/or Complexity of Data Reviewed Labs: ordered. Radiology: ordered.   18 year old female presents for evaluation.  Please see HPI for further details.  On examination patient is afebrile and nontachycardic.  Her lung sounds are clear bilaterally, she is nonhypoxic.  Abdomen soft and compressible.  Neurological examinations baseline.  Posterior oropharynx with erythema, no exudate.  Uvula slightly deviated to the left, does have some tonsillar swelling on the right.  She is handling secretions appropriately, there is no drooling.  There is a change in phonation.  Will assess with CT soft tissue neck with contrast.  Will collect labs to include CBC, BMP, group A strep testing.  Did plan to give patient steroids however she reports that she is allergic to steroids.  At the end of my shift, patient work up not complete. Signed out to [employer] pending imaging and lab work.   Final Clinical Impression(s) / ED Diagnoses Final diagnoses:  Sore throat    Rx / DC Orders ED Discharge  Orders     None         Clent Ridges 03/06/24 0981    Tilden Fossa, MD 03/06/24 [phone]

## 2024-03-06 NOTE — Discharge Instructions (Signed)
 Please use Tylenol or ibuprofen for pain.  You may use 600 mg ibuprofen every 6 hours or 1000 mg of Tylenol every 6 hours.  You may choose to alternate between the 2.  This would be most effective.  Not to exceed 4 g of Tylenol within 24 hours.  Not to exceed 3200 mg ibuprofen 24 hours.  You can use the viscous lidocaine to help with the sore throat, please follow-up with the ENT doctor whose contact formation I provided, please take some time to rest, your symptoms should improve on their own over time.

## 2024-03-06 NOTE — ED Notes (Signed)
 Patient bacK from CT

## 2024-03-06 NOTE — ED Notes (Signed)
 Patient transported to CT

## 2024-03-15 DIAGNOSIS — F422 Mixed obsessional thoughts and acts: Secondary | ICD-10-CM | POA: Diagnosis not present

## 2024-03-29 DIAGNOSIS — F422 Mixed obsessional thoughts and acts: Secondary | ICD-10-CM | POA: Diagnosis not present

## 2024-04-20 ENCOUNTER — Telehealth: Payer: Self-pay | Admitting: Family Medicine

## 2024-05-09 ENCOUNTER — Telehealth: Payer: Self-pay | Admitting: Family Medicine

## 2024-05-12 ENCOUNTER — Institutional Professional Consult (permissible substitution) (INDEPENDENT_AMBULATORY_CARE_PROVIDER_SITE_OTHER): Admitting: Otolaryngology

## 2024-07-06 DIAGNOSIS — H60331 Swimmer's ear, right ear: Secondary | ICD-10-CM | POA: Diagnosis not present

## 2024-07-06 DIAGNOSIS — H9203 Otalgia, bilateral: Secondary | ICD-10-CM | POA: Diagnosis not present

## 2024-07-07 ENCOUNTER — Ambulatory Visit: Admitting: Family Medicine

## 2024-08-18 ENCOUNTER — Ambulatory Visit: Payer: Self-pay

## 2024-08-18 NOTE — Telephone Encounter (Signed)
 FYI Only or Action Required?: Action required by provider: Refusing ED, needs call back with further recommendations asap, alerted [facility] to ED refusal.  Patient was last seen in primary care on 11/01/2023 by Jolinda Norene HERO, DO.  Called Nurse Triage reporting Abscess, Dizziness, metallic taste in mouth when feeling about to pass out, and spreading redness.  Symptoms began several days ago.  Interventions attempted: Nothing.  Symptoms are: rapidly worsening.  Triage Disposition: Go to ED Now (or PCP Triage)  Patient/caregiver understands and will follow disposition?: No, refuses disposition     Copied from CRM [id number]. Topic: Clinical - Red Word Triage >> Aug 18, 2024 11:48 AM Dana Lopez wrote: Pt has a boil located on her right inner thigh for 3 days and it looks infected. Hot to the touch. Reason for Disposition  Patient sounds very sick or weak to the triager  Answer Assessment - Initial Assessment Questions Advised pt go to ED for symptoms, refusing at this time, requesting appt asap. Advised hospital right away if any worsening symptoms. Sending message to office for call back asap, alerted [facility] to ED refusal.   Mom Dana Lopez on phone with pt in background  1. APPEARANCE of BOIL: What does the boil look like?      Circumference redness around it is around size of softball just not swollen that big, actual boil/cyst is about size of nickel 2. LOCATION: Where is the boil located?      Right inner thigh 3. NUMBER: How many boils are there?      1 4. SIZE: How big is the boil? (e.g., inches, cm; compare to size of a coin or other object)     nickel 5. ONSET: When did the boil start?     3 days ago 6. PAIN: Is there any pain? If Yes, ask: How bad is the pain?   (Scale 1-10; or mild, moderate, severe)     Depending on how I sit 5-6/10 7. FEVER: Do you have a fever? If Yes, ask: What is it, how was it measured, and when did it start?      Feels fine right now,  unsure pt feels like may have had yesterday 9. OTHER SYMPTOMS: Do you have any other symptoms? (e.g., shaking chills, weakness, rash elsewhere on body)     Little swollen Pus came out, still not popped all the way Hot to the touch No shaking, chills, or weakness Yesterday really dizzy thought was going to pass out with metallic taste in mouth 10. PREGNANCY: Is there any chance you are pregnant? When was your last menstrual period?       No  Mom stating she herself has compromised immune system, cannot take her to ED Mom stating we will take the chances if there's something worse  Protocols used: Boil (Skin Abscess)-A-AH

## 2024-08-18 NOTE — Telephone Encounter (Signed)
 Called and spoke with patient she is going to the UC

## 2024-09-02 DIAGNOSIS — U071 COVID-19: Secondary | ICD-10-CM | POA: Diagnosis not present

## 2024-09-02 DIAGNOSIS — R070 Pain in throat: Secondary | ICD-10-CM | POA: Diagnosis not present

## 2024-09-20 ENCOUNTER — Ambulatory Visit (INDEPENDENT_AMBULATORY_CARE_PROVIDER_SITE_OTHER): Admitting: Family Medicine

## 2024-09-20 VITALS — BP 120/78 | HR 92 | Temp 97.8°F | Ht 67.0 in | Wt 317.4 lb

## 2024-09-20 DIAGNOSIS — M25532 Pain in left wrist: Secondary | ICD-10-CM | POA: Diagnosis not present

## 2024-09-20 DIAGNOSIS — J302 Other seasonal allergic rhinitis: Secondary | ICD-10-CM

## 2024-09-20 MED ORDER — CETIRIZINE HCL 10 MG PO TABS: 10.0000 mg | ORAL_TABLET | Freq: Every day | ORAL | 11 refills | Status: AC

## 2024-09-20 NOTE — Progress Notes (Signed)
 Acute Office Visit  Subjective:     Patient ID: Dana Lopez, female    DOB: 04-02-2006, 18 y.o.   MRN: [id number]  Chief Complaint  Patient presents with   Wrist Pain    Left wrist, for the past 2 weeks, that is gradually getting worse.   Was told at 68-13 yrs old that she has carpel tunnel, but says the pain has never been this bad.     HPI Patient is in today for L wrist that started 1.5-2 weeks ago.   It started very mild at first but has gotten worse.  Denies known trauma to the left wrist.  Denies any falls.  Pain shoots up her arm occasionally.   Fingers get numb occasionally.   Patient uses her L hand for everything except writing.   Works out at [employer]   Patient reports that ever since she has been little she sleeps with her wrist bent under her chin.    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 Has never been to orthopedics.    Denies fever, joint swelling or redness.     Also asking for refill of Zyrtec  for seasonal allergies.  ROS      Objective:    BP 120/78   Pulse 92   Temp 97.8 F (36.6 C)   Ht 5' 7 (1.702 m)   Wt (!) 317 lb 6.4 oz (144 kg)   SpO2 99%   BMI 49.71 kg/m    Physical Exam Vitals reviewed.  Constitutional:      Appearance: Normal appearance.  HENT:     Head: Normocephalic and atraumatic.  Eyes:     Extraocular Movements: Extraocular movements intact.     Conjunctiva/sclera: Conjunctivae normal.     Pupils: Pupils are equal, round, and reactive to light.  Cardiovascular:     Rate and Rhythm: Normal rate and regular rhythm.     Pulses: Normal pulses.     Heart sounds: Normal heart sounds. No murmur heard. Pulmonary:     Effort: Pulmonary effort is normal. No respiratory distress.     Breath sounds: Normal breath sounds.  Musculoskeletal:        General: Tenderness present. No swelling or  deformity. Normal range of motion.     Cervical back: Normal range of motion and neck supple.     Comments: Pain with extension and flexion of the L hand.  Pain with palpation of the midline dorsal side of L hand and wirst.    No nodule palpated.   No L hand snuffbox pain.  No erythema, warmth, or swelling to L hand or wrist.    Skin:    General: Skin is warm and dry.     Capillary Refill: Capillary refill takes less than 2 seconds.  Neurological:     General: No focal deficit present.     Mental Status: She is alert and oriented to person, place, and time.  Psychiatric:        Mood and Affect: Mood normal.        Behavior: Behavior normal.     No results found for any visits on 09/20/24.      Assessment & Plan:   Problem List Items Addressed This Visit   None Visit Diagnoses       Left wrist pain    -  Primary     Seasonal allergies       Relevant Medications   cetirizine  (ZYRTEC ) 10 MG tablet       Meds ordered this encounter  Medications   cetirizine  (ZYRTEC ) 10 MG tablet    Sig: Take 1 tablet (10 mg total) by mouth daily.    Dispense:  30 tablet    Refill:  11    Supervising Provider:   JOLINDA NORENE HERO [[id number]]    Discussed options for conservative symptomatic treatment at this time, x-rays of the hand, or referral to hand specialist.  The patient voiced interest in conservative symptomatic treatment at this time.  Will treat left wrist/hand symptoms conservatively for overuse.  Provided patient with a wrist splint.  Recommend wearing during the day and at night for the next few days and to then wear it at night only for the next 2 weeks.  Recommended NSAIDs for the next 1-2 weeks.  Recommended topical ice.  Discussed follow-up if symptoms acutely worsen, no improvement over the next 1 week, no resolution of symptoms by 2 weeks, or for any other concerns.  If no improvement in symptoms will will plan for either x-ray of the hand/wrist and/or  referral to hand specialist.   Seasonal allergies - zyrtec  refilled.     Return if symptoms worsen or fail to improve.  Oneil LELON Severin, FNP

## 2024-11-21 ENCOUNTER — Ambulatory Visit: Admitting: Family Medicine

## 2024-11-21 ENCOUNTER — Encounter: Payer: Self-pay | Admitting: Family Medicine

## 2024-11-21 VITALS — BP 123/89 | HR 90 | Temp 97.8°F | Ht 65.25 in | Wt 314.5 lb

## 2024-11-21 DIAGNOSIS — F32A Depression, unspecified: Secondary | ICD-10-CM

## 2024-11-21 DIAGNOSIS — L659 Nonscarring hair loss, unspecified: Secondary | ICD-10-CM

## 2024-11-21 DIAGNOSIS — F419 Anxiety disorder, unspecified: Secondary | ICD-10-CM

## 2024-11-21 MED ORDER — FLUOXETINE HCL 20 MG PO CAPS: 20.0000 mg | ORAL_CAPSULE | Freq: Every day | ORAL | 3 refills | Status: AC

## 2024-11-21 MED ORDER — BUSPIRONE HCL 5 MG PO TABS: ORAL_TABLET | ORAL | 0 refills | Status: AC

## 2024-11-21 NOTE — Progress Notes (Signed)
 "  Subjective: CC: Follow-up anxiety depression PCP: Dana Norene HERO, DO Dana Lopez is a 18 y.o. female presenting to clinic today for:  Anxiety and depression Patient reports compliance with Prozac  20 mg daily.  Needs refills.  She admits that her anxiety is not well-controlled and would like to see what else she can do to help with this.  She just her new job in [place].  Continues to struggle with appetite.  Could not tolerate Ozempic secondary to GI side effects but still wants to have weight loss.  She does report hair thinning/loss.  Cannot do labs today but would like to have these done at some point.   ROS: Per HPI  Allergies[1] Past Medical History:  Diagnosis Date   Allergy    Anxiety    Migraine    Current Medications[2] Social History   Socioeconomic History   Marital status: Single    Spouse name: Not on file   Number of children: Not on file   Years of education: Not on file   Highest education level: Some college, no degree  Occupational History   Not on file  Tobacco Use   Smoking status: Never   Smokeless tobacco: Never  Vaping Use   Vaping status: Never Used  Substance and Sexual Activity   Alcohol  use: No   Drug use: No   Sexual activity: Never  Other Topics Concern   Not on file  Social History Narrative   [name] is an 10th grade student.   She is at [employer].   She lives with both parents.    She has two older sisters.   Social Drivers of Health   Tobacco Use: Low Risk (11/21/2024)   Patient History    Smoking Tobacco Use: Never    Smokeless Tobacco Use: Never    Passive Exposure: Not on file  Financial Resource Strain: Low Risk (09/20/2024)   Overall Financial Resource Strain (CARDIA)    Difficulty of Paying Living Expenses: Not hard at all  Food Insecurity: No Food Insecurity (09/20/2024)   Epic    Worried About Radiation Protection Practitioner of Food in the Last Year: Never true    Ran Out of Food in the Last Year: Never true   Transportation Needs: No Transportation Needs (09/20/2024)   Epic    Lack of Transportation (Medical): No    Lack of Transportation (Non-Medical): No  Physical Activity: Inactive (09/20/2024)   Exercise Vital Sign    Days of Exercise per Week: 0 days    Minutes of Exercise per Session: Not on file  Stress: No Stress Concern Present (09/20/2024)   [employer] of Occupational Health - Occupational Stress Questionnaire    Feeling of Stress: Not at all  Recent Concern: Stress - Stress Concern Present (07/06/2024)   [employer] of Occupational Health - Occupational Stress Questionnaire    Feeling of Stress: Rather much  Social Connections: Moderately Isolated (09/20/2024)   Social Connection and Isolation Panel    Frequency of Communication with Friends and Family: More than three times a week    Frequency of Social Gatherings with Friends and Family: Once a week    Attends Religious Services: More than 4 times per year    Active Member of [employer] or Organizations: No    Attends Banker Meetings: Not on file    Marital Status: Never married  Intimate Partner Violence: Not on file  Depression (PHQ2-9): Low Risk (09/20/2024)   Depression (PHQ2-9)    PHQ-2  Score: 0  Alcohol  Screen: Not on file  Housing: Unknown (09/20/2024)   Epic    Unable to Pay for Housing in the Last Year: No    Number of Times Moved in the Last Year: Not on file    Homeless in the Last Year: No  Utilities: Low Risk (07/06/2024)   Received [facility]   Utilities    Within the past 12 months, have you been unable to get utilities(heat, electricity) when it was really needed?: No  Health Literacy: Not on file   Family History  Problem Relation Age of Onset   Crohn's disease Mother    Depression Mother    Asthma Mother    Hypertension Father     Objective: Office vital signs reviewed. BP 123/89   Pulse 90   Temp 97.8 F (36.6 C)   Ht 5' 5.25 (1.657 m)   Wt (!) 314 lb 8 oz  (142.7 kg)   SpO2 98%   BMI 51.94 kg/m   Physical Examination:  General: Awake, alert, morbidly obese, No acute distress HEENT: No scleral injection.  No exophthalmos.  No goiter appreciated. Cardio: regular rate and rhythm  Pulm: Normal work of breathing on room air Neuro: No tremor     11/21/2024    4:17 PM 09/20/2024    8:03 AM 11/01/2023   10:40 AM  Depression screen PHQ 2/9  Decreased Interest 1 0 1  Down, Depressed, Hopeless 1 0 1  PHQ - 2 Score 2 0 2  Altered sleeping 1 0 1  Tired, decreased energy 1 0 2  Change in appetite 1 0 2  Feeling bad or failure about yourself  1 0 1  Trouble concentrating 1 0 2  Moving slowly or fidgety/restless 0 0 0  Suicidal thoughts 0 0 0  PHQ-9 Score 7 0  10   Difficult doing work/chores Somewhat difficult Not difficult at all      Data saved with a previous flowsheet row definition      11/21/2024    4:18 PM 09/20/2024    8:03 AM 11/01/2023   10:41 AM 09/29/2023    9:56 AM  GAD 7 : Generalized Anxiety Score  Nervous, Anxious, on Edge 2 0 2 2  Control/stop worrying 2 0 2 2  Worry too much - different things 2 0 2 2  Trouble relaxing 2 0 2 1  Restless 2 0 2 1  Easily annoyed or irritable 2 0 2 2  Afraid - awful might happen 2 0 2 2  Total GAD 7 Score 14 0 14 12  Anxiety Difficulty Somewhat difficult Not difficult at all Somewhat difficult Somewhat difficult   Assessment/ Plan: 18 y.o. female   Anxiety and depression - Plan: FLUoxetine  (PROZAC ) 20 MG capsule, busPIRone  (BUSPAR ) 5 MG tablet, CMP14+EGFR  Morbid obesity (HCC) - Plan: Lipid panel, Bayer DCA Hb A1c Waived, CMP14+EGFR, Vitamin D , 25-hydroxy, Amb Ref to Medical Weight Management  Hair thinning - Plan: CBC with Differential/Platelet, CMP14+EGFR, Iron, TIBC and Ferritin Panel, TSH + free T4   We discussed options including advancing fluoxetine  to 40 mg daily versus adding medication.  She wanted to trial BuSpar .  Titrating doses sent to pharmacy.  I would like to see  her closely back in 4 weeks and she will reach out to me via MyChart sooner than that if she is having any intolerance to the medication.  We discussed options for management of weight.  Sadly, had intolerance to the  GLP but GIP may be a little bit more tolerable.  Uncertain if insurance would cover Zepbound however.  We also discussed consideration for healthy weight and wellness referral and she is amenable to this as she is now working in [place] I would really like to try throughout how to modify her metabolism because she does not really intake much calories throughout the day.  I have placed fasting labs also and she will schedule this at her earliest convenience.  Will check thyroid given reports of hair thinning and difficulty with weight.   Norene CHRISTELLA Fielding, DO [facility] [phone]     [1]  Allergies Allergen Reactions   Other Anaphylaxis    Throat swells and heart rate slows down drastically. Any steroid she said oral or injections does this   Cortisone     Decreased HR, increased HA (shot only) Prednisone  pills ok- per pt and mom    Adhesive [Tape] Rash   Escitalopram  Itching    Heart palpatations  [2]  Current Outpatient Medications:    cetirizine  (ZYRTEC ) 10 MG tablet, Take 1 tablet (10 mg total) by mouth daily., Disp: 30 tablet, Rfl: 11   FLUoxetine  (PROZAC ) 20 MG capsule, Take 1 capsule (20 mg total) by mouth daily., Disp: 90 capsule, Rfl: 3  "

## 2024-12-05 ENCOUNTER — Encounter (INDEPENDENT_AMBULATORY_CARE_PROVIDER_SITE_OTHER): Payer: Self-pay

## 2024-12-15 ENCOUNTER — Ambulatory Visit: Admitting: Family Medicine

## 2024-12-18 ENCOUNTER — Encounter: Payer: Self-pay | Admitting: Family

## 2024-12-18 ENCOUNTER — Ambulatory Visit (INDEPENDENT_AMBULATORY_CARE_PROVIDER_SITE_OTHER): Admitting: Family

## 2024-12-18 VITALS — BP 124/76 | HR 76 | Temp 97.7°F | Ht 65.25 in | Wt 314.6 lb

## 2024-12-18 DIAGNOSIS — M25531 Pain in right wrist: Secondary | ICD-10-CM

## 2024-12-18 MED ORDER — NAPROXEN 500 MG PO TABS: 500.0000 mg | ORAL_TABLET | Freq: Two times a day (BID) | ORAL | 0 refills | Status: AC

## 2024-12-18 NOTE — Progress Notes (Signed)
 "  Subjective:    Patient ID: Dana Lopez, female    DOB: 2006-03-31, 19 y.o.   MRN: [id number]  Chief Complaint  Patient presents with   Wrist Pain    Both wrist but the right one is worse. Patient states the right one is a different kind of hurt.   PT presents to the office today with bilateral wrist pain that is worse in right that started October. She was seen in the office  and give wrist splint on left wrist and NSAID's. Reports her pain improved, but now her right wrist hurts worse. States her pain is worse when bending or picking anything up. Has some slight weakness in right hand with gripping.   She has been taking ibuprofen  BID with mild relief.   Denies any injury, but reports she sleeps with both wrists flexed under her chin at night and has done this since she was a child.  Wrist Pain  The pain is present in the right wrist and left wrist. This is a recurrent problem. The current episode started more than 1 month ago. There has been no history of extremity trauma. The problem occurs constantly. The problem has been gradually worsening. The quality of the pain is described as aching. The pain is at a severity of 8/10. The pain is moderate. She has tried NSAIDS for the symptoms. The treatment provided mild relief.      Review of Systems  All other systems reviewed and are negative.   Social History   Socioeconomic History   Marital status: Single    Spouse name: Not on file   Number of children: Not on file   Years of education: Not on file   Highest education level: Some college, no degree  Occupational History   Not on file  Tobacco Use   Smoking status: Never   Smokeless tobacco: Never  Vaping Use   Vaping status: Never Used  Substance and Sexual Activity   Alcohol  use: No   Drug use: No   Sexual activity: Never  Other Topics Concern   Not on file  Social History Narrative   Dana Lopez is an 10th grade student.   She is at [employer].   She lives with  both parents.    She has two older sisters.   Social Drivers of Health   Tobacco Use: Low Risk (12/18/2024)   Patient History    Smoking Tobacco Use: Never    Smokeless Tobacco Use: Never    Passive Exposure: Not on file  Financial Resource Strain: Low Risk (09/20/2024)   Overall Financial Resource Strain (CARDIA)    Difficulty of Paying Living Expenses: Not hard at all  Food Insecurity: No Food Insecurity (09/20/2024)   Epic    Worried About Radiation Protection Practitioner of Food in the Last Year: Never true    Ran Out of Food in the Last Year: Never true  Transportation Needs: No Transportation Needs (09/20/2024)   Epic    Lack of Transportation (Medical): No    Lack of Transportation (Non-Medical): No  Physical Activity: Inactive (09/20/2024)   Exercise Vital Sign    Days of Exercise per Week: 0 days    Minutes of Exercise per Session: Not on file  Stress: No Stress Concern Present (09/20/2024)   [employer] of Occupational Health - Occupational Stress Questionnaire    Feeling of Stress: Not at all  Recent Concern: Stress - Stress Concern Present (07/06/2024)   [employer] of Occupational Health -  Occupational Stress Questionnaire    Feeling of Stress: Rather much  Social Connections: Moderately Isolated (09/20/2024)   Social Connection and Isolation Panel    Frequency of Communication with Friends and Family: More than three times a week    Frequency of Social Gatherings with Friends and Family: Once a week    Attends Religious Services: More than 4 times per year    Active Member of [employer] or Organizations: No    Attends Banker Meetings: Not on file    Marital Status: Never married  Depression (PHQ2-9): Medium Risk (11/21/2024)   Depression (PHQ2-9)    PHQ-2 Score: 7  Alcohol  Screen: Not on file  Housing: Unknown (09/20/2024)   Epic    Unable to Pay for Housing in the Last Year: No    Number of Times Moved in the Last Year: Not on file    Homeless in the Last  Year: No  Utilities: Low Risk (07/06/2024)   [facility]   Utilities    Within the past 12 months, have you been unable to get utilities(heat, electricity) when it was really needed?: No  Health Literacy: Not on file   Family History  Problem Relation Age of Onset   Crohn's disease Mother    Depression Mother    Asthma Mother    Hypertension Father         Objective:   Physical Exam Vitals reviewed.  Constitutional:      General: She is not in acute distress.    Appearance: She is well-developed.  HENT:     Head: Normocephalic and atraumatic.     Right Ear: Tympanic membrane normal.     Left Ear: Tympanic membrane normal.  Eyes:     Pupils: Pupils are equal, round, and reactive to light.  Neck:     Thyroid: No thyromegaly.  Cardiovascular:     Rate and Rhythm: Normal rate and regular rhythm.     Heart sounds: Normal heart sounds. No murmur heard. Pulmonary:     Effort: Pulmonary effort is normal. No respiratory distress.     Breath sounds: Normal breath sounds. No wheezing.  Abdominal:     General: Bowel sounds are normal. There is no distension.     Palpations: Abdomen is soft.     Tenderness: There is no abdominal tenderness.  Musculoskeletal:        General: No tenderness. Normal range of motion.     Cervical back: Normal range of motion and neck supple.     Comments: Positive Phalen sign in right wrist, negative tinel, pain in right wrist with flexion  Skin:    General: Skin is warm and dry.  Neurological:     Mental Status: She is alert and oriented to person, place, and time.     Cranial Nerves: No cranial nerve deficit.     Deep Tendon Reflexes: Reflexes are normal and symmetric.  Psychiatric:        Behavior: Behavior normal.        Thought Content: Thought content normal.        Judgment: Judgment normal.       BP 124/76   Pulse 76   Temp 97.7 F (36.5 C) (Temporal)   Ht 5' 5.25 (1.657 m)   Wt (!) 314 lb 9.6 oz (142.7 kg)    BMI 51.95 kg/m      Assessment & Plan:  [name] comes in today with chief complaint of Wrist Pain (  Both wrist but the right one is worse. Patient states the right one is a different kind of hurt.)   Diagnosis and orders addressed:  1. Right wrist pain (Primary) Splint given for right wrist. Wear bilateral splints while sleeping or doing any repetitive motions.  Pt can not tolerate prednisone . Will given naprosyn  BID  Rest Follow up in 2 weeks, if pain continues will send to Hand specialists  - naproxen  (NAPROSYN ) 500 MG tablet; Take 1 tablet (500 mg total) by mouth 2 (two) times daily with a meal.  Dispense: 30 tablet; Refill: 0    Bari Learn, FNP   "

## 2024-12-18 NOTE — Patient Instructions (Signed)
 Wrist Pain, Adult There are many things that can cause wrist pain. Some common causes include: An injury to the wrist area, such as a sprain, strain, or broken bone (fracture). Overuse of the joint. A condition that causes increased pressure on a nerve in the wrist (carpal tunnel syndrome). Wear and tear of the joints that occurs with aging (osteoarthritis). Other types of joint inflammation and stiffness (arthritis). Sometimes, the cause of wrist pain is not known. Often, the pain goes away when you follow instructions from your health care provider for relieving pain at home. This may include resting the wrist, icing the wrist, or using a splint or an elastic wrap for a short time. If your wrist pain continues, it is important to tell your provider. Follow these instructions at home: If you have a removable splint or elastic wrap: Wear the splint or wrap as told by your provider. Remove it only as told by your provider. Ask your provider if you may remove it for bathing. Check the skin around the splint or wrap every day. Tell your provider about any concerns. Loosen the splint or wrap if your fingers tingle, become numb, or turn cold and blue. Keep the splint or wrap clean. If the splint or wrap is not waterproof: Do not let it get wet. Cover it with a watertight covering when you take a bath or shower. Managing pain, stiffness, and swelling  If told, put ice on the painful area. If you have a removable splint or wrap, remove it as told by your provider. Put ice in a plastic bag. Place a towel between your skin and the bag or between your splint or wrap and the bag. Leave the ice on for 20 minutes, 2-3 times a day. If your skin turns bright red, remove the ice right away to prevent skin damage. The risk of damage is higher if you cannot feel pain, heat, or cold. Move your fingers often to reduce stiffness and swelling. Raise (elevate) the injured area above the level of your heart while  you are sitting or lying down. Activity Rest your affected wrist as told by your provider. Return to your normal activities as told by your provider. Ask your provider what activities are safe for you. Ask your provider when it is safe to drive if you have a splint or wrap on your wrist. Do exercises as told by your provider. General instructions Pay attention to any changes in your symptoms. Take over-the-counter and prescription medicines only as told by your provider. Contact a health care provider if: You have a sudden, sharp pain in the wrist, hand, or arm that is different or new. Any swelling or bruising on your wrist or hand gets worse. Your skin becomes red, has a rash, or has open sores. Your pain does not get better or it gets worse. You have a fever or chills. Get help right away if: You lose feeling in your fingers or hand. Your fingers turn white, very red, or cold and blue. You cannot move your fingers. This information is not intended to replace advice given to you by your health care provider. Make sure you discuss any questions you have with your health care provider. Document Revised: 08/21/2022 Document Reviewed: 08/21/2022 Elsevier Patient Education  2024 [employer].

## 2024-12-19 ENCOUNTER — Encounter: Payer: Self-pay | Admitting: Family Medicine

## 2024-12-19 NOTE — Telephone Encounter (Signed)
 Yes, just let her know I will call during lunch, around 1230

## 2024-12-22 ENCOUNTER — Telehealth: Admitting: Family Medicine

## 2024-12-22 ENCOUNTER — Encounter: Payer: Self-pay | Admitting: Family Medicine

## 2024-12-22 DIAGNOSIS — F32A Depression, unspecified: Secondary | ICD-10-CM

## 2024-12-22 DIAGNOSIS — F419 Anxiety disorder, unspecified: Secondary | ICD-10-CM

## 2024-12-22 DIAGNOSIS — Z9189 Other specified personal risk factors, not elsewhere classified: Secondary | ICD-10-CM

## 2024-12-22 DIAGNOSIS — G479 Sleep disorder, unspecified: Secondary | ICD-10-CM | POA: Diagnosis not present

## 2024-12-22 MED ORDER — TRAZODONE HCL 100 MG PO TABS: 100.0000 mg | ORAL_TABLET | Freq: Every day | ORAL | 0 refills | Status: AC

## 2024-12-22 MED ORDER — BUSPIRONE HCL 15 MG PO TABS: 15.0000 mg | ORAL_TABLET | Freq: Three times a day (TID) | ORAL | 0 refills | Status: AC

## 2024-12-22 NOTE — Progress Notes (Signed)
 MyChart Video visit  Subjective: CC: Anxiety, insomnia PCP: Jolinda Norene HERO, DO Dana Lopez is a 19 y.o. female. Patient provides verbal consent for consult held via video.  Due to COVID-19 pandemic this visit was conducted virtually. This visit type was conducted due to national recommendations for restrictions regarding the COVID-19 Pandemic (e.g. social distancing, sheltering in place) in an effort to limit this patient's exposure and mitigate transmission in our community. All issues noted in this document were discussed and addressed.  A physical exam was not performed with this format.   Location of patient: home Location of provider: [facility] Others present for call: none  1. Sleep difficulties She reports difficulty falling asleep and staying asleep.  She had a sleep study done several years ago when she was a child but has not had anything as an adult.  She wakes frequently. Has been on trazodone  in the past, which was helpful.  She reports compliance with Prozac .  Got up to 10 mg twice daily of BuSpar  but has not found it to be especially helpful with anxiety. Starting a new job soon in [employer].  ROS: Per HPI  Allergies[1] Past Medical History:  Diagnosis Date   Allergy    Anxiety    Migraine    Current Medications[2]  Gen: Well-appearing female, no acute distress Psych: Mood stable, speech normal, affect appropriate  Assessment/ Plan: 19 y.o. female   Sleep difficulties - Plan: Ambulatory referral to Sleep Studies, traZODone  (DESYREL ) 100 MG tablet  At risk for sleep apnea - Plan: Ambulatory referral to Sleep Studies  Anxiety and depression - Plan: busPIRone  (BUSPAR ) 15 MG tablet  Referral to sleep studies.  Trazodone  added for sleep.  Caution serotonin syndrome with moderate dose Prozac .  She is tolerated this combination in the past without difficulty.  Will go ahead and evaluate for obstructive sleep apnea as she is certainly at risk for this given  BMI and reports of frequent interruptions of sleep  Buspirone  increased to 3 times daily dosing and increased to 15 mg 3 times daily.  She will reach out to me via MyChart prior to her next March visit if needed.  Start time: 1:02pm End time: 1:11pm  Total time spent on patient care (including video visit/ documentation): 15 minutes  Tom Macpherson M Emrick Hensch, DO [facility] [phone]      [1]  Allergies Allergen Reactions   Other Anaphylaxis    Throat swells and heart rate slows down drastically. Any steroid she said oral or injections does this   Cortisone     Decreased HR, increased HA (shot only) Prednisone  pills ok- per pt and mom    Adhesive [Tape] Rash   Escitalopram  Itching    Heart palpatations  [2]  Current Outpatient Medications:    cetirizine  (ZYRTEC ) 10 MG tablet, Take 1 tablet (10 mg total) by mouth daily., Disp: 30 tablet, Rfl: 11   FLUoxetine  (PROZAC ) 20 MG capsule, Take 1 capsule (20 mg total) by mouth daily., Disp: 100 capsule, Rfl: 3   naproxen  (NAPROSYN ) 500 MG tablet, Take 1 tablet (500 mg total) by mouth 2 (two) times daily with a meal., Disp: 30 tablet, Rfl: 0

## 2025-02-02 ENCOUNTER — Ambulatory Visit: Admitting: Family Medicine
